# Patient Record
Sex: Male | Born: 1960 | Race: White | Hispanic: No | Marital: Single
Health system: Southern US, Community
[De-identification: ages and names within clinical notes are randomized; demographics above are authoritative.]

---

## 2016-08-03 ENCOUNTER — Encounter: Payer: Medicaid Other | Attending: Internal Medicine | Admitting: Internal Medicine

## 2016-08-03 DIAGNOSIS — Z931 Gastrostomy status: Secondary | ICD-10-CM | POA: Diagnosis not present

## 2016-08-03 DIAGNOSIS — Y842 Radiological procedure and radiotherapy as the cause of abnormal reaction of the patient, or of later complication, without mention of misadventure at the time of the procedure: Secondary | ICD-10-CM | POA: Insufficient documentation

## 2016-08-03 DIAGNOSIS — Z87891 Personal history of nicotine dependence: Secondary | ICD-10-CM | POA: Insufficient documentation

## 2016-08-03 DIAGNOSIS — C139 Malignant neoplasm of hypopharynx, unspecified: Secondary | ICD-10-CM | POA: Insufficient documentation

## 2016-08-03 DIAGNOSIS — L598 Other specified disorders of the skin and subcutaneous tissue related to radiation: Secondary | ICD-10-CM | POA: Insufficient documentation

## 2016-08-03 DIAGNOSIS — E43 Unspecified severe protein-calorie malnutrition: Secondary | ICD-10-CM | POA: Insufficient documentation

## 2016-08-03 DIAGNOSIS — Z930 Tracheostomy status: Secondary | ICD-10-CM | POA: Diagnosis not present

## 2016-08-03 NOTE — Progress Notes (Signed)
[name], [name] ([id number]) Visit Report for 08/03/2016 Abuse/Suicide Risk Screen Details Patient Name: [name], [name] Date of Service: 08/03/2016 2:15 PM Medical Record Patient Account Number: [id number] Number: Treating RN: [name] [name] (54 y.o. Other Clinician: Date of Birth/Sex: Male) Treating [name] Primary Care Physician/Extender: [name] Physician: Referring Physician: Nicky Pugh [name] in Treatment: 0 Abuse/Suicide Risk Screen Items Answer ABUSE/SUICIDE RISK SCREEN: Has anyone close to you tried to hurt or harm you recentlyo No Do you feel uncomfortable with anyone in your familyo No Has anyone forced you do things that you didnot want to doo No Do you have any thoughts of harming yourselfo No Patient displays signs or symptoms of abuse and/or neglect. No Electronic Signature(s) Signed: 08/03/2016 4:22:14 PM By: [name] Entered By: [name] on 08/03/2016 14:27:39 [name] ([id number]) -------------------------------------------------------------------------------- Activities of Daily Living Details Patient Name: [name] Date of Service: 08/03/2016 2:15 PM Medical Record Patient Account Number: [id number] Number: Treating RN: [name] [name] (55 y.o. Other Clinician: Date of Birth/Sex: Male) Treating [name] Primary Care Physician/Extender: [name] Physician: Referring Physician: Nicky Pugh [name] in Treatment: 0 Activities of Daily Living Items Answer Activities of Daily Living (Please select one for each item) Drive Automobile Not Able Take Medications Need Assistance Use Telephone Completely Able Care for Appearance Need Assistance Use Toilet Completely Able Bath / Shower Need Assistance Dress Self Completely Able Feed Self Completely Able Walk Completely Able Get In / Out Bed Completely Able Housework Need Assistance Prepare Meals Not Able Handle Money  Need Assistance Shop for Self Not Able Electronic Signature(s) Signed: 08/03/2016 4:22:14 PM By: [name] Entered By: [name] on 08/03/2016 14:28:59 [name] ([id number]) -------------------------------------------------------------------------------- Education Assessment Details Patient Name: [name] Date of Service: 08/03/2016 2:15 PM Medical Record Patient Account Number: [id number] Number: Treating RN: [name] [name] (54 y.o. Other Clinician: Date of Birth/Sex: Male) Treating [name] Primary Care Physician/Extender: [name] Physician: Referring Physician: Milana Na in Treatment: 0 Primary Learner Assessed: Patient Learning Preferences/Education Level/Primary Language Learning Preference: Explanation, Printed Material Highest Education Level: College or Above Preferred Language: English Cognitive Barrier Assessment/Beliefs Language Barrier: No Translator Needed: No Memory Deficit: No Emotional Barrier: No Cultural/Religious Beliefs Affecting Medical No Care: Physical Barrier Assessment Impaired Vision: No Impaired Hearing: No Decreased Hand dexterity: No Knowledge/Comprehension Assessment Knowledge Level: High Comprehension Level: High Ability to understand written High instructions: Ability to understand verbal High instructions: Motivation Assessment Anxiety Level: Calm Cooperation: Cooperative Education Importance: Acknowledges Need Interest in Health Problems: Asks Questions Perception: Coherent Willingness to Engage in Self- High Management Activities: [name] ([id number]) Readiness to Engage in Self- Management Activities: Electronic Signature(s) Signed: 08/03/2016 4:22:14 PM By: [name] Entered By: [name] on 08/03/2016 14:29:27 [name] ([id number]) -------------------------------------------------------------------------------- Fall Risk  Assessment Details Patient Name: [name] Date of Service: 08/03/2016 2:15 PM Medical Record Patient Account Number: [id number] Number: Treating RN: [name] [name] (54 y.o. Other Clinician: Date of Birth/Sex: Male) Treating [name] Primary Care Physician/Extender: [name] Physician: Referring Physician: Nicky Pugh [name] in Treatment: 0 Fall Risk Assessment Items Have you had 2 or more falls in the last 12 monthso 0 No Have you had any fall that resulted in injury in the last 12 monthso 0 No FALL RISK ASSESSMENT: History of falling - immediate or within 3 months 0 No Secondary diagnosis 15 Yes Ambulatory aid None/bed rest/wheelchair/nurse 0 No Crutches/cane/walker 0 No Furniture 0 No IV Access/Saline  Lock 0 No Gait/Training Normal/bed rest/immobile 0 No Weak 10 Yes Impaired 0 No Mental Status Oriented to own ability 0 Yes Electronic Signature(s) Signed: 08/03/2016 4:22:14 PM By: [name] Entered By: [name] on 08/03/2016 14:29:46 [name] ([id number]) -------------------------------------------------------------------------------- Foot Assessment Details Patient Name: [name] Date of Service: 08/03/2016 2:15 PM Medical Record Patient Account Number: [id number] Number: Treating RN: [name] (55 y.o. Other Clinician: Date of Birth/Sex: Male) Treating [name] Primary Care Physician/Extender: [name] Physician: Referring Physician: Nicky Pugh [name] in Treatment: 0 Foot Assessment Items Site Locations + = Sensation present, - = Sensation absent, C = Callus, U = Ulcer R = Redness, W = Warmth, M = Maceration, PU = Pre-ulcerative lesion F = Fissure, S = Swelling, D = Dryness Assessment Right: Left: Other Deformity: No No Prior Foot Ulcer: No No Prior Amputation: No No Charcot Joint: No No Ambulatory Status: Gait: Electronic Signature(s) Signed:  08/03/2016 4:22:14 PM By: [name] Entered By: [name] on 08/03/2016 14:30:50 [name] ([id number]) [name] ([id number]) -------------------------------------------------------------------------------- Nutrition Risk Assessment Details Patient Name: [name] Date of Service: 08/03/2016 2:15 PM Medical Record Patient Account Number: [id number] Number: Treating RN: [name] [name] (55 y.o. Other Clinician: Date of Birth/Sex: Male) Treating [name] Primary Care Physician/Extender: [name] Physician: Referring Physician: Nicky Pugh [name] in Treatment: 0 Height (in): 68 Weight (lbs): 115 Body Mass Index (BMI): 17.5 Nutrition Risk Assessment Items NUTRITION RISK SCREEN: I have an illness or condition that made me change the kind and/or 2 Yes amount of food I eat I eat fewer than two meals per day 0 No I eat few fruits and vegetables, or milk products 0 No I have three or more drinks of beer, liquor or wine almost every day 0 No I have tooth or mouth problems that make it hard for me to eat 0 No I don't always have enough money to buy the food I need 0 No I eat alone most of the time 0 No I take three or more different prescribed or over-the-counter drugs a 1 Yes day Without wanting to, I have lost or gained 10 pounds in the last six 2 Yes months I am not always physically able to shop, cook and/or feed myself 0 No Nutrition Protocols Good Risk Protocol Provide education on Moderate Risk Protocol 0 nutrition Electronic Signature(s) Signed: 08/03/2016 4:22:14 PM By: [name] Entered By: [name] on 08/03/2016 14:30:40

## 2016-08-05 NOTE — Progress Notes (Signed)
DAK, CHEUNG ([id number]) Visit Report for 08/03/2016 Chief Complaint Document Details Patient Name: [name], [name] Date of Service: 08/03/2016 2:15 PM Medical Record Patient Account Number: [id number] Number: Treating RN: Ahmed Prima 1961/05/06 (55 y.o. Other Clinician: Date of Birth/Sex: Male) Treating Kynzleigh Bandel Primary Care Physician/Extender: Orland Dec, ERNEST Physician: Referring Physician: Nicky Pugh Weeks in Treatment: 0 Information Obtained from: Patient Chief Complaint Patient is here for consultation with regards to soft tissue radionecrosis of the larynx Electronic Signature(s) Signed: 08/04/2016 4:32:31 PM By: Linton Ham MD Entered By: Linton Ham on 08/03/2016 15:50:53 [place], [place] ([id number]) -------------------------------------------------------------------------------- HPI Details Patient Name: [name] Date of Service: 08/03/2016 2:15 PM Medical Record Patient Account Number: [id number] Number: Treating RN: Ahmed Prima January 18, 1961 (55 y.o. Other Clinician: Date of Birth/Sex: Male) Treating Jalaiya Oyster Primary Care Physician/Extender: Orland Dec, ERNEST Physician: Referring Physician: Nicky Pugh Weeks in Treatment: 0 History of Present Illness HPI Description: 08/03/16; this is a 55 year old man who was referred by his otolaryngologist Dr. 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 Per the patient this was diagnosed in February of this year. He was a 37 year pack year smoker to that point but has stopped smoking since that time. He was treated with both radiation and chemotherapy at [facility]. Unfortunately I have none of these details. He apparently completed his treatment regimen in on 04/23/16. The patient was living at home apparently functioning on his own and [place]. He tells me initially things went well however he developed increasing difficulties talking and swallowing with severe throat pain and he was admitted to hospital at [facility] in July. No specific details of this hospitalization however on 07/19/16 he required placement of a PEG tube for nutritional support. Patient tells me that he can only take liquids and cannot swallow solid food. He is listed as having severe protein calorie malnutrition. He required placement of a tracheostomy on 8/10 secondary to significant laryngeal edema which was progressive with airway compromise. Laryngoscopy on 07/12/16 showed significant laryngeal edema so that his vocal cords could not be specifically inspected secondary to edema of the arytenoid process. He has since been admitted to [place] care skilled facility. He is dependent on peg tube feedings. He has a tracheostomy with a Passy-Muir valve and trach collar oxygen Other than the history of cancer the patient's medical history seems unremarkable. He does not carry a history of COPD in spite of his smoking history no cardiac issues. He is not a diabetic. He has stopped smoking I really hope to be able to get into care everywhere to look more specifically at his [facility] records however it may be too soon I could not access any records through [facility]. Currently I do not have his exact the radiation dosage and over what time frame specifically. The patient describes pain in his throat and points to his upper neck area for discomfort. He is able to speak with aid of Passy-Muir valve and his speech is audible. He cannot swallow and is totally PEG tube defendant for nutrition although he can take liquids Electronic Signature(s) Signed: 08/04/2016 4:32:31 PM By: Linton Ham MD Entered By: Linton Ham on 08/03/2016 16:11:06 [name] ([id number]) -------------------------------------------------------------------------------- Physical Exam  Details Patient Name: [name] Date of Service: 08/03/2016 2:15 PM Medical Record Patient Account Number: [id number] Number: Treating RN: Ahmed Prima 1961-09-01 (55 y.o. Other Clinician: Date  of Birth/Sex: Male) Treating Jalin Erpelding Primary Care Physician/Extender: Orland Dec, ERNEST Physician: Referring Physician: Nicky Pugh Weeks in Treatment: 0 Constitutional Sitting or standing Blood Pressure is within target range for patient.. Pulse regular and within target range for patient.Marland Kitchen Respirations regular, non-labored and within target range.. Temperature is normal and within the target range for the patient.. Eyes Conjunctivae clear. No discharge.. Ears, Nose, Mouth, and Throat Patient can hear normal speaking tones without difficulty.. Oropharynx within normal limits, without erythema, exudate or ulceration.. Neck Tracheostomy in place with a trach collar but no oxygen. Respiratory Respiratory effort is easy and symmetric bilaterally. Rate is normal at rest and on room air.. Bilateral breath sounds are clear and equal in all lobes with no wheezes, rales or rhonchi.. Cardiovascular Heart rhythm and rate regular, without murmur or gallop.. Gastrointestinal (GI) No liver or spleen enlargement or tenderness.. Lymphatic . No lymphadenopathy or glandular swelling noted in neck.. No lymphadenopathy or glandular swelling noted in axillae.Marland Kitchen Psychiatric No evidence of depression, anxiety, or agitation. Calm, cooperative, and communicative. Appropriate interactions and affect.. Electronic Signature(s) Signed: 08/04/2016 4:32:31 PM By: Linton Ham MD Entered By: Linton Ham on 08/03/2016 16:21:05 [name] ([id number]) -------------------------------------------------------------------------------- Problem List Details Patient Name: [name] Date of Service: 08/03/2016 2:15 PM Medical Record Patient Account Number:  [id number] Number: Treating RN: Ahmed Prima 1961-08-09 (55 y.o. 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 Other Clinician: Date of Birth/Sex: Male) Treating Brysen Shankman Primary Care Physician/Extender: Orland Dec, ERNEST Physician: Referring Physician: Nicky Pugh Weeks in Treatment: 0 Subjective Chief Complaint Information obtained from Patient Patient is here for consultation with regards to soft tissue radionecrosis of the larynx History of Present Illness (HPI) 08/03/16; this is a 55 year old man who was referred by his otolaryngologist Dr. 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 Per the patient this was diagnosed in  February of this year. He was a 37 year pack year smoker to that point but has stopped smoking since that time. He was treated with both radiation and chemotherapy at [facility]. Unfortunately I have none of these details. He apparently completed his treatment regimen in on 04/23/16. The patient was living at home apparently functioning on his own and [place]. He tells me initially things went well however he developed increasing difficulties talking and swallowing with severe throat pain and he was admitted to hospital at [facility] in July. No specific details of this hospitalization however on 07/19/16 he required placement of a PEG tube for nutritional support. Patient tells me that he can only take liquids and  cannot swallow solid food. He is listed as having severe protein calorie malnutrition. He required placement of a tracheostomy on 8/10 secondary to significant laryngeal edema which was progressive with airway compromise. Laryngoscopy on 07/12/16 showed significant laryngeal edema so that his vocal cords could not be specifically inspected secondary to edema of the arytenoid process. He has since been admitted to [place] care skilled facility. He is dependent on peg tube feedings. He has a tracheostomy with a Passy-Muir valve and trach collar oxygen Other than the history of cancer the patient's medical history seems unremarkable. He does not carry a history of COPD in spite of his smoking history no cardiac issues. He is not a diabetic. He has stopped smoking I really hope to be able to get into care everywhere to look more specifically at his [facility] records however it may be too soon I could not access any records through [place]. Currently I do not have his exact the radiation dosage and over what time frame specifically. The patient describes pain in his throat and points to his upper neck area for discomfort. He is able to speak with aid of  Passy-Muir valve and his speech is audible. He cannot swallow and is totally PEG tube defendant for nutrition although he can take liquids Patient History [name], [name] ([id number]) Information obtained from Patient. Allergies NKDA Family History Cancer - Father, Heart Disease - Father, Lung Disease - Father, No family history of Diabetes, Hereditary Spherocytosis, Hypertension, Kidney Disease, Seizures, Stroke, Thyroid Problems, Tuberculosis. Social History Former smoker - quit Jan 30, 2016 d/t cancer, Marital Status - Single, Alcohol Use - Never, Drug Use - No History, Caffeine Use - Never. Medical History Oncologic Patient has history of Received Chemotherapy - 2017 at [facility], Received Radiation - last treatment was 04/22/2016 at [place] (ROS) Constitutional Symptoms (General Health) The patient has no complaints or symptoms. Eyes The patient has no complaints or symptoms. Ear/Nose/Mouth/Throat trach Hematologic/Lymphatic The patient has no complaints or symptoms. Respiratory trach Cardiovascular The patient has no complaints or symptoms. Gastrointestinal PEG GERD Endocrine The patient has no complaints or symptoms. Genitourinary The patient has no complaints or symptoms. Immunological The patient has no complaints or symptoms. Integumentary (Skin) The patient has no complaints or symptoms. Musculoskeletal The patient has no complaints or symptoms. Neurologic The patient has no complaints or symptoms. [place], [place] ([id number]) Objective Constitutional Sitting or standing Blood Pressure is within target range for patient.. Pulse regular and within target range for patient.Marland Kitchen Respirations regular, non-labored and within target range.. Temperature is normal and within the target range for the patient.. Vitals Time Taken: 2:19 PM, Height: 68 in, Source: Stated, Weight: 115 lbs, Source: Stated, BMI: 17.5, Temperature: 97.8 F, Pulse: 75 bpm, Respiratory  Rate: 16 breaths/min, Blood Pressure: 114/63 mmHg. Eyes Conjunctivae clear. No discharge.. Ears, Nose, Mouth, and Throat Patient can hear normal speaking tones without difficulty.. Oropharynx within normal limits, without erythema, exudate or ulceration.. Neck Tracheostomy in place with a trach collar but no oxygen. Respiratory Respiratory effort is easy and symmetric bilaterally. Rate is normal at rest and on room air.. Bilateral breath sounds are clear and equal in all lobes with no wheezes, rales or rhonchi.. Cardiovascular Heart rhythm and rate regular, without murmur or gallop.. Gastrointestinal (GI) No liver or spleen enlargement or tenderness.. Lymphatic No lymphadenopathy or glandular swelling noted in neck.. No lymphadenopathy or glandular swelling noted in axillae.Marland Kitchen Psychiatric No evidence of depression, anxiety, or agitation. Calm, cooperative, and communicative. 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 Unfortunately I have no access currently to his [employer] records and there is nothing currently on care everywhere in [place]. Superficially I think hyperbaric oxygen could conceivably help this man. #2 on a purely insurance note the patient is currently at [place] under [facility]. I currently have my doubts that [facility]  will cover hyperbaric oxygen for a patient they are currently paying for in a long-term care facility. #3 I will need specifics on the patient's actual radiation dosing and time frame. I'll see what we can do to get more detailed records on this patient. Electronic Signature(s) Signed: 08/04/2016 4:32:31 PM By: Linton Ham MD Entered By: Linton Ham on 08/03/2016 16:27:03 [name] ([id number]) -------------------------------------------------------------------------------- ROS/PFSH Details Patient Name: [name] Date of Service: 08/03/2016 2:15 PM Medical Record Patient Account Number: [id number] Number: Treating RN: Ahmed Prima 03/16/1961 (54 y.o. Other Clinician: Date of Birth/Sex: Male) Treating Nayden Czajka Primary Care Physician/Extender: Orland Dec, ERNEST Physician: Referring Physician: Nicky Pugh Weeks in Treatment: 0 Information Obtained From Patient Wound History Constitutional Symptoms (General Health) Complaints and Symptoms: No Complaints or Symptoms Eyes Complaints and Symptoms: No Complaints or Symptoms Ear/Nose/Mouth/Throat Complaints and Symptoms: Review of System Notes: trach Hematologic/Lymphatic Complaints and Symptoms: No Complaints or Symptoms Respiratory Complaints and Symptoms: Review of System Notes: trach Cardiovascular Complaints and Symptoms: No Complaints or Symptoms Gastrointestinal Complaints and Symptoms: Review of System Notes: PEG [name], [name] ([id number]) GERD Endocrine Complaints and Symptoms: No Complaints or Symptoms Genitourinary Complaints and Symptoms: No Complaints or Symptoms Immunological Complaints and Symptoms: No Complaints or Symptoms Integumentary (Skin) Complaints and Symptoms: No Complaints or Symptoms Musculoskeletal Complaints and Symptoms: No Complaints or Symptoms Neurologic Complaints and Symptoms: No Complaints or Symptoms Oncologic Medical History: Positive  for: Received Chemotherapy - 2017 at [facility]; Received Radiation - last treatment was 04/22/2016 at [facility] Immunizations Pneumococcal Vaccine: Received Pneumococcal Vaccination: No Family and Social History Cancer: Yes - Father; Diabetes: No; Heart Disease: Yes - Father; Hereditary Spherocytosis: No; Hypertension: No; Kidney Disease: No; Lung Disease: Yes - Father; Seizures: No; Stroke: No; Thyroid Problems: No; Tuberculosis: No; Former smoker - quit Jan 30, 2016 d/t cancer; Marital Status - Single; Alcohol Use: Never; Drug Use: No History; Caffeine Use: Never; Financial Concerns: No; Food, Clothing or Shelter Needs: No; Support System Lacking: No; Transportation Concerns: No; Advanced Directives: No; Patient does not want information on Advanced Directives; Do not resuscitate: No; Living Will: No; Medical Power of AttorneyKort [name], [place] ([id number]) Electronic Signature(s) Signed: 08/03/2016 4:22:14 PM By: Alric Quan Signed: 08/04/2016 4:32:31 PM By: Linton Ham MD Entered By: Alric Quan on 08/03/2016 14:42:10 [place], [place] ([id number]) -------------------------------------------------------------------------------- SuperBill Details Patient Name: [name] Date of Service: 08/03/2016 Medical Record Patient Account Number: [id number] Number: Treating RN: Ahmed Prima 04/15/1961 (54 y.o. Other Clinician: Date of Birth/Sex: Male) Treating Nusrat Encarnacion Primary Care Physician: Nicky Pugh Physician/Extender: G Referring Physician: Nicky Pugh Weeks in Treatment: 0 Diagnosis Coding ICD-10 Codes Code Description  L59.8 Other specified disorders of the skin and subcutaneous tissue related to radiation Facility Procedures CPT4 Code: [id number] Description: [id number] - WOUND CARE VISIT-LEV 3 EST PT Modifier: Quantity: 1 Physician Procedures CPT4: Description Modifier Quantity Code [id number] - WC PHYS LEVEL 4 - NEW PT 1 ICD-10 Description Diagnosis L59.8  Other specified disorders of the skin and subcutaneous tissue related to radiation Electronic Signature(s) Signed: 08/04/2016 4:32:31 PM By: Linton Ham MD Previous Signature: 08/03/2016 4:22:14 PM Version By: Alric Quan Entered By: Linton Ham on 08/03/2016 16:28:13

## 2016-08-05 NOTE — Progress Notes (Signed)
CHAWN, PIECH ([id number]) Visit Report for 08/03/2016 Allergy List Details Patient Name: [name], [name] Date of Service: 08/03/2016 2:15 PM Medical Record Patient Account Number: [id number] Number: Treating RN: Ahmed Prima 05-13-1961 (55 y.o. Other Clinician: Date of Birth/Sex: Male) Treating ROBSON, MICHAEL Primary Care Physician: Nicky Pugh Physician/Extender: G Referring Physician: Nicky Pugh Weeks in Treatment: 0 Allergies Active Allergies NKDA Allergy Notes Electronic Signature(s) Signed: 08/03/2016 4:22:14 PM By: Alric Quan Entered By: Alric Quan on 08/03/2016 14:22:21 [place], [name] ([id number]) -------------------------------------------------------------------------------- Arrival Information Details Patient Name: [name] Date of Service: 08/03/2016 2:15 PM Medical Record Patient Account Number: [id number] Number: Treating RN: Ahmed Prima Jun 17, 1961 (55 y.o. Other Clinician: Date of Birth/Sex: Male) Treating ROBSON, MICHAEL Primary Care Physician: Nicky Pugh Physician/Extender: G Referring Physician: Milana Na in Treatment: 0 Visit Information Patient Arrived: Ambulatory Arrival Time: 14:18 Accompanied By: self Transfer Assistance: None Patient Identification Verified: Yes Secondary Verification Process Yes Completed: Patient Requires Transmission-Based No Precautions: Patient Has Alerts: No Electronic Signature(s) Signed: 08/03/2016 4:22:14 PM By: Alric Quan Entered By: Alric Quan on 08/03/2016 14:19:03 [place], [place] ([id number]) -------------------------------------------------------------------------------- Clinic Level of Care Assessment Details Patient Name: [name] Date of Service: 08/03/2016 2:15 PM Medical Record Patient Account Number: [id number] Number: Treating RN: Ahmed Prima 05-04-1961 (55 y.o. Other Clinician: Date of Birth/Sex: Male) Treating ROBSON,  MICHAEL Primary Care Physician: Nicky Pugh Physician/Extender: G Referring Physician: Milana Na in Treatment: 0 Clinic Level of Care Assessment Items TOOL 2 Quantity Score X - Use when only an EandM is performed on the INITIAL visit 1 0 ASSESSMENTS - Nursing Assessment / Reassessment X - General Physical Exam (combine w/ comprehensive assessment (listed just 1 20 below) when performed on new pt. evals) X - Comprehensive Assessment (HX, ROS, Risk Assessments, Wounds Hx, etc.) 1 25 ASSESSMENTS - Wound and Skin Assessment / Reassessment []  - Simple Wound Assessment / Reassessment - one wound 0 []  - Complex Wound Assessment / Reassessment - multiple wounds 0 []  - Dermatologic / Skin Assessment (not related to wound area) 0 ASSESSMENTS - Ostomy and/or Continence Assessment and Care []  - Incontinence Assessment and Management 0 []  - Ostomy Care Assessment and Management (repouching, etc.) 0 PROCESS - Coordination of Care []  - Simple Patient / Family Education for ongoing care 0 X - Complex (extensive) Patient / Family Education for ongoing care 1 20 X - Staff obtains Programmer, systems, Records, Test Results / Process Orders 1 10 X - Staff telephones HHA, Nursing Homes / Clarify orders / etc 1 10 []  - Routine Transfer to another Facility (non-emergent condition) 0 []  - Routine Hospital Admission (non-emergent condition) 0 X - New Admissions / Biomedical engineer / Ordering NPWT, Apligraf, etc. 1 15 []  - Emergency Hospital Admission (emergent condition) 0 [place], [place] ([id number]) X - Simple Discharge Coordination 1 10 []  - Complex (extensive) Discharge Coordination 0 PROCESS - Special Needs []  - Pediatric / Minor Patient Management 0 []  - Isolation Patient Management 0 []  - Hearing / Language / Visual special needs 0 []  - Assessment of Community assistance (transportation, D/C planning, etc.) 0 []  - Additional assistance / Altered mentation 0 []  - Support Surface(s) Assessment  (bed, cushion, seat, etc.) 0 INTERVENTIONS - Wound Cleansing / Measurement []  - Wound Imaging (photographs - any number of wounds) 0 []  - Wound Tracing (instead of photographs) 0 []  - Simple Wound Measurement - one wound 0 []  - Complex Wound Measurement - multiple wounds 0 []  - Simple Wound Cleansing - one wound 0 []  -  Complex Wound Cleansing - multiple wounds 0 INTERVENTIONS - Wound Dressings []  - Small Wound Dressing one or multiple wounds 0 []  - Medium Wound Dressing one or multiple wounds 0 []  - Large Wound Dressing one or multiple wounds 0 []  - Application of Medications - injection 0 INTERVENTIONS - Miscellaneous []  - External ear exam 0 []  - Specimen Collection (cultures, biopsies, blood, body fluids, etc.) 0 []  - Specimen(s) / Culture(s) sent or taken to Lab for analysis 0 []  - Patient Transfer (multiple staff / [name] Lift / Similar devices) 0 []  - Simple Staple / Suture removal (25 or less) 0 [place], Alfonsa ([id number]) []  - Complex Staple / Suture removal (26 or more) 0 []  - Hypo / Hyperglycemic Management (close monitor of Blood Glucose) 0 []  - Ankle / Brachial Index (ABI) - do not check if billed separately 0 Has the patient been seen at the hospital within the last three years: Yes Total Score: 110 Level Of Care: New/Established - Level 3 Electronic Signature(s) Signed: 08/03/2016 4:22:14 PM By: Alric Quan Entered By: Alric Quan on 08/03/2016 [place], [place] ([id number]) -------------------------------------------------------------------------------- Encounter Discharge Information Details Patient Name: [name] Date of Service: 08/03/2016 2:15 PM Medical Record Patient Account Number: [id number] Number: Treating RN: Ahmed Prima [birth year] (55 y.o. Other Clinician: Date of Birth/Sex: Male) Treating ROBSON, MICHAEL Primary Care Physician: Nicky Pugh Physician/Extender: G Referring Physician: Milana Na in Treatment:  0 Encounter Discharge Information Items Discharge Pain Level: 0 Discharge Condition: Stable Ambulatory Status: Ambulatory Discharge Destination: Home Transportation: Other Accompanied By: self Schedule Follow-up Appointment: No Medication Reconciliation completed and provided to Patient/Care No Alanya Vukelich: Provided on Clinical Summary of Care: 08/03/2016 Form Type Recipient Paper Patient mr Electronic Signature(s) Signed: 08/03/2016 3:22:53 PM By: Lorine Bears RCP, RRT, CHT Entered By: Lorine Bears on 08/03/2016 15:22:53 [place], [place] ([id number]) -------------------------------------------------------------------------------- Lower Extremity Assessment Details Patient Name: [name] Date of Service: 08/03/2016 2:15 PM Medical Record Patient Account Number: [id number] Number: Treating RN: Ahmed Prima 07/21/1961 (54 y.o. Other Clinician: Date of Birth/Sex: Male) Treating ROBSON, MICHAEL Primary Care Physician: Nicky Pugh Physician/Extender: G Referring Physician: Nicky Pugh Weeks in Treatment: 0 Electronic Signature(s) Signed: 08/03/2016 4:22:14 PM By: Alric Quan Entered By: Alric Quan on 08/03/2016 14:22:35 [place], [place] ([id number]) -------------------------------------------------------------------------------- Multi Wound Chart Details Patient Name: [name] Date of Service: 08/03/2016 2:15 PM Medical Record Patient Account Number: [id number] Number: Treating RN: Ahmed Prima 1961/02/20 (54 y.o. Other Clinician: Date of Birth/Sex: Male) Treating ROBSON, MICHAEL Primary Care Physician: Nicky Pugh Physician/Extender: G Referring Physician: Nicky Pugh Weeks in Treatment: 0 Vital Signs Height(in): 68 Pulse(bpm): 75 Weight(lbs): 115 Blood Pressure 114/63 (mmHg): Body Mass Index(BMI): 17 Temperature(F): 97.8 Respiratory Rate 16 (breaths/min): Wound Assessments Treatment  Notes Electronic Signature(s) Signed: 08/03/2016 4:22:14 PM By: Alric Quan Entered By: Alric Quan on 08/03/2016 15:00:38 [name] ([id number]) -------------------------------------------------------------------------------- Multi-Disciplinary Care Plan Details Patient Name: [name] Date of Service: 08/03/2016 2:15 PM Medical Record Patient Account Number: [id number] Number: Treating RN: Ahmed Prima 19-Nov-1961 (55 y.o. Other Clinician: Date of Birth/Sex: Male) Treating ROBSON, MICHAEL Primary Care Physician: Nicky Pugh Physician/Extender: G Referring Physician: Nicky Pugh Weeks in Treatment: 0 Active Inactive Abuse / Safety / Falls / Self Care Management Nursing Diagnoses: Potential for falls Goals: Patient will remain injury free Date Initiated: 08/03/2016 Goal Status: Active Interventions: Assess fall risk on admission and as needed Notes: Nutrition Nursing Diagnoses: Imbalanced nutrition Goals: Patient/caregiver agrees to and verbalizes understanding of need to use nutritional supplements  and/or vitamins as prescribed Date Initiated: 08/03/2016 Goal Status: Active Interventions: Assess patient nutrition upon admission and as needed per policy Notes: Orientation to the Wound Care Program Nursing Diagnoses: Knowledge deficit related to the wound healing center program [place], [place] ([id number]) Goals: Patient/caregiver will verbalize understanding of the [facility] Date Initiated: 08/03/2016 Goal Status: Active Interventions: Provide education on orientation to the wound center Notes: Pain, Acute or Chronic Nursing Diagnoses: Pain, acute or chronic: actual or potential Potential alteration in comfort, pain Goals: Patient will verbalize adequate pain control and receive pain control interventions during procedures as needed Date Initiated: 08/03/2016 Goal Status: Active Interventions: Assess comfort goal  upon admission Complete pain assessment as per visit requirements Notes: Electronic Signature(s) Signed: 08/03/2016 4:22:14 PM By: Alric Quan Entered By: Alric Quan on 08/03/2016 14:59:59 [place], [place] ([id number]) -------------------------------------------------------------------------------- Pain Assessment Details Patient Name: [name] Date of Service: 08/03/2016 2:15 PM Medical Record Patient Account Number: [id number] Number: Treating RN: Ahmed Prima 08-Nov-1961 (55 y.o. Other Clinician: Date of Birth/Sex: Male) Treating ROBSON, MICHAEL Primary Care Physician: Nicky Pugh Physician/Extender: G Referring Physician: Nicky Pugh Weeks in Treatment: 0 Active Problems Location of Pain Severity and Description of Pain Patient Has Paino Yes Site Locations Rate the pain. Current Pain Level: 3 Worst Pain Level: 6 Least Pain Level: 1 Character of Pain Describe the Pain: Aching, Throbbing Pain Management and Medication Current Pain Management: Notes new trach opening Electronic Signature(s) Signed: 08/03/2016 4:22:14 PM By: Alric Quan Entered By: Alric Quan on 08/03/2016 14:19:56 [place], [place] ([id number]) -------------------------------------------------------------------------------- Patient/Caregiver Education Details Patient Name: [name] Date of Service: 08/03/2016 2:15 PM Medical Record Patient Account Number: [id number] Number: Treating RN: Ahmed Prima Aug 07, 1961 (54 y.o. Other Clinician: Date of Birth/Gender: Male) Treating ROBSON, MICHAEL Primary Care Physician: Nicky Pugh Physician/Extender: G Referring Physician: Milana Na in Treatment: 0 Education Assessment Education Provided To: Patient Education Topics Provided Welcome To [facility]: Handouts: Welcome [facility] Methods: Explain/Verbal Electronic Signature(s) Signed: 08/03/2016 4:22:14 PM By: Alric Quan Entered By: Alric Quan on 08/03/2016 15:16:47 [place], [place] ([id number]) -------------------------------------------------------------------------------- Vitals Details Patient Name: [name] Date of Service: 08/03/2016 2:15 PM Medical Record Patient Account Number: [id number] Number: Treating RN: Ahmed Prima May 11, 1961 (55 y.o. Other Clinician: Date of Birth/Sex: Male) Treating ROBSON, [place] Primary Care Physician: Nicky Pugh Physician/Extender: G Referring Physician: Nicky Pugh Weeks in Treatment: 0 Vital Signs Time Taken: 14:19 Temperature (F): 97.8 Height (in): 68 Pulse (bpm): 75 Source: Stated Respiratory Rate (breaths/min): 16 Weight (lbs): 115 Blood Pressure (mmHg): 114/63 Source: Stated Reference Range: 80 - 120 mg / dl Body Mass Index (BMI): 17.5 Electronic Signature(s) Signed: 08/03/2016 4:22:14 PM By: Alric Quan Entered By: Alric Quan on 08/03/2016 14:21:55

## 2016-08-24 ENCOUNTER — Ambulatory Visit
Admission: RE | Admit: 2016-08-24 | Discharge: 2016-08-24 | Disposition: A | Discharge status: Home / Self Care | Payer: Medicaid Other | Source: Ambulatory Visit | Attending: Internal Medicine | Admitting: Internal Medicine

## 2016-08-24 ENCOUNTER — Encounter: Payer: MEDICAID | Admitting: Internal Medicine

## 2016-08-24 ENCOUNTER — Other Ambulatory Visit: Payer: Self-pay | Admitting: Internal Medicine

## 2016-08-24 DIAGNOSIS — Z9889 Other specified postprocedural states: Secondary | ICD-10-CM | POA: Diagnosis present

## 2016-08-24 DIAGNOSIS — Z9289 Personal history of other medical treatment: Secondary | ICD-10-CM

## 2016-08-24 DIAGNOSIS — J984 Other disorders of lung: Secondary | ICD-10-CM | POA: Insufficient documentation

## 2016-08-25 ENCOUNTER — Encounter: Payer: Medicaid Other | Attending: Internal Medicine | Admitting: Internal Medicine

## 2016-08-25 DIAGNOSIS — Y842 Radiological procedure and radiotherapy as the cause of abnormal reaction of the patient, or of later complication, without mention of misadventure at the time of the procedure: Secondary | ICD-10-CM | POA: Diagnosis not present

## 2016-08-25 DIAGNOSIS — C139 Malignant neoplasm of hypopharynx, unspecified: Secondary | ICD-10-CM | POA: Insufficient documentation

## 2016-08-25 DIAGNOSIS — Z930 Tracheostomy status: Secondary | ICD-10-CM | POA: Diagnosis not present

## 2016-08-25 DIAGNOSIS — Z87891 Personal history of nicotine dependence: Secondary | ICD-10-CM | POA: Diagnosis not present

## 2016-08-25 DIAGNOSIS — E43 Unspecified severe protein-calorie malnutrition: Secondary | ICD-10-CM | POA: Diagnosis not present

## 2016-08-25 DIAGNOSIS — Z931 Gastrostomy status: Secondary | ICD-10-CM | POA: Insufficient documentation

## 2016-08-25 DIAGNOSIS — L598 Other specified disorders of the skin and subcutaneous tissue related to radiation: Secondary | ICD-10-CM | POA: Insufficient documentation

## 2016-08-25 NOTE — Progress Notes (Signed)
TIRSO, STOCKS ([id number]) Visit Report for 08/25/2016 Arrival Information Details Patient Name: [name], [name] Date of Service: 08/25/2016 10:00 AM Medical Record Patient Account Number: [id number] Number: Treating RN: 09/18/1961 (55 y.o. Other Clinician: Jacqulyn Bath Date of Birth/Sex: Male) Treating ROBSON, MICHAEL Primary Care Physician/Extender: Orland Dec, ERNEST Physician: Referring Physician: Milana Na in Treatment: 3 Visit Information History Since Last Visit Added or deleted any medications: No Patient Arrived: Ambulatory Any new allergies or adverse reactions: No Arrival Time: 09:20 Had a fall or experienced change in No Accompanied By: self activities of daily living that may affect Transfer Assistance: None risk of falls: Patient Identification Verified: Yes Signs or symptoms of abuse/neglect since last No Secondary Verification Process Yes visito Completed: Hospitalized since last visit: No Patient Requires Transmission-Based No Pain Present Now: No Precautions: Patient Has Alerts: No Electronic Signature(s) Signed: 08/25/2016 1:38:04 PM By: Lorine Bears RCP, RRT, CHT Entered By: Lorine Bears on 08/25/2016 10:49:11 [place], [name] ([id number]) -------------------------------------------------------------------------------- Encounter Discharge Information Details Patient Name: [name] Date of Service: 08/25/2016 10:00 AM Medical Record Patient Account Number: [id number] Number: Treating RN: 07/13/1961 (55 y.o. Other Clinician: Jacqulyn Bath Date of Birth/Sex: Male) Treating ROBSON, MICHAEL Primary Care Physician/Extender: Orland Dec, ERNEST Physician: Referring Physician: Milana Na in Treatment: 3 Encounter Discharge Information Items Discharge Pain Level: 0 Discharge Condition: Stable Ambulatory Status: Ambulatory Other (Note Discharge Destination: Required) Transportation:  Other [name] Accompanied By: [name] Schedule Follow-up Appointment: No Medication Reconciliation completed and provided to Patient/Care No Charika Mikelson: Clinical Summary of Care: Notes Patient will return to [facility] from her via [place]. Patient has an HBO treatment scheduled on 08/26/16 at 10:00 am. Electronic Signature(s) Signed: 08/25/2016 1:38:04 PM By: Lorine Bears RCP, RRT, CHT Entered By: Lorine Bears on 08/25/2016 12:23:57 [place], [name] ([id number]) -------------------------------------------------------------------------------- Vitals Details Patient Name: [name] Date of Service: 08/25/2016 10:00 AM Medical Record Patient Account Number: [id number] Number: Treating RN: 01-15-1961 (55 y.o. Other Clinician: Jacqulyn Bath Date of Birth/Sex: Male) Treating ROBSON, MICHAEL Primary Care Physician/Extender: Orland Dec, ERNEST Physician: Referring Physician: Nicky Pugh Weeks in Treatment: 3 Vital Signs Time Taken: 09:25 Temperature (F): 98.5 Height (in): 68 Pulse (bpm): 90 Weight (lbs): 115 Respiratory Rate (breaths/min): 16 Body Mass Index (BMI): 17.5 Blood Pressure (mmHg): 112/80 Reference Range: 80 - 120 mg / dl Electronic Signature(s) Signed: 08/25/2016 1:38:04 PM By: Lorine Bears RCP, RRT, CHT Entered By: Lorine Bears on 08/25/2016 10:49:45

## 2016-08-25 NOTE — Progress Notes (Addendum)
[name], [name] ([id number]) Visit Report for 08/25/2016 HBO Details Patient Name: [name], [name] Date of Service: 08/25/2016 10:00 AM Medical Record Patient Account Number: [id number] Number: Treating RN: 04/19/1961 (55 y.o. 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 HBO Treatment Details Treatment Number: 1 Patient Type: Outpatient Chamber Type: Monoplace Chamber Serial #: [id number] Treatment Protocol: 2.0 ATA with 90 minutes oxygen, and no air breaks Treatment Details Compression Rate Down: 1.5 psi / minute De-Compression Rate Up: 1.5 psi / minute Air breaks and breathing Compress Tx Pressure periods Decompress Decompress Begins Reached (leave unused spaces Begins Ends blank) Chamber Pressure (ATA) 1 2 - - - - - - 2 1 Clock Time (24 hr) 09:58 10:08 - - - - - - 11:38 11:48 Treatment Length: 110 (minutes) Treatment Segments: 4 Capillary Blood Glucose Pre Capillary Blood Glucose (mg/dl): [name] Capillary Blood Glucose (mg/dl): Vital Signs Capillary Blood Glucose Reference Range: 80 - 120 mg / dl HBO Diabetic Blood Glucose Intervention Range: <131 mg/dl or >249 mg/dl Time Vitals Blood Respiratory Capillary Blood Glucose Pulse Action Type: Pulse: Temperature: Taken: Pressure: Rate: Glucose (mg/dl): Meter #: Oximetry (%) Taken: Pre 09:25 112/80 90 16 98.5 [name] 11:50 124/90 66 16 98.2 [place], [place] ([id number]) Treatment Response Treatment Completion Status: Treatment Completed without Adverse Event Treatment Notes 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 Per Jonelle Sidle at [email or web address] this neck band is completely wrapped with kerlix gauze so that the Velcro is covered entirely for chamber safety. The patient also has a feeding tube which has been securely taped to his abdomen for safety during the treatment Physician Notes No concerns with treatment given. Patient's first dive tolerated this well. 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 Otherwise his cardiopulmonary exams were normal. Tympanic membranes look normal as well HBO Attestation I certify that I supervised this HBO treatment in accordance with Medicare guidelines. A trained Yes emergency response team is readily available per hospital policies and procedures. Continue HBOT as ordered. Yes Electronic Signature(s) Signed: 08/25/2016 4:24:47 PM By: Linton Ham MD Previous Signature: 08/25/2016 1:38:04 PM Version By: Lorine Bears RCP, RRT, CHT Entered By: Linton Ham on 08/25/2016 14:35:15 [place], [place] ([id number]) -------------------------------------------------------------------------------- HBO Risk Assessment Details Patient Name: [name] Date of Service: 08/25/2016 10:00 AM Medical Record Patient Account Number: [id number] Number: Treating RN: Baruch Gouty, RN, BSN, Rita 06-Oct-1961 [phone] y.o. Other Clinician: Jacqulyn Bath Date of Birth/Sex: Male) Treating ROBSON, MICHAEL Primary Care Physician/Extender: Orland Dec, ERNEST Physician: Referring Physician: Nicky Pugh Weeks in Treatment: 3 HBO Risk Assessment Items Answer Barotrauma Risks: Upper Respiratory Infections Yes Prior Radiation Treatment to Head/Neck Yes Tracheostomy Yes Ear problems or surgery (otosclerosis)- Consider pressure equalization tubes No Sinus Problems, Sinus Obstruction No Pulmonary Risks: Currently seeing a pulmonologisto No Emphysema No Pneumothorax No Tuberculosis No Other lung problems (COPD with CO2  retention, lesions, surgery) -Refer to CPGs No Congestive heart Failure -Consider holding HBO if ejection fraction<30% No History of smoking No Bullous Disease, Blebs No Cardiac Risks: Currently seeing a cardiologisto No Pacemaker/AICD No Hypertension No Diuretic Used (water pill). If yes, last time taken: No History of prior or current malignancy (Cancer) Surgery No Radiation  therapy No Chemotherapy No Ophthalmic Risks: Optic Neuritis No Cataracts No Myopia Yes [name], [name] ([id number]) Retinopathy or Retinal Detachment Surgery- Consider pressure equalization tubes No Confinement Anxiety Claustrophobia No Dialysis Dialysis No Any implants; medical or non-medical No Pregnancy No Diabetes HgbA1C within 3 months No Seizures Seizures No Currently using these medications: Aspirin No Digoxin (CHF patient) No Narcotics Yes Nitroprusside No Phenothiazine (Thorazine,etc.) No Prednisone or other steroids No Disulfiram (Antabuse) No Mafenide Acetate (Sulfamylon-burn cream) No Amiodarone No Electronic Signature(s) Signed: 08/25/2016 9:29:45 AM By: Regan Lemming BSN, RN Entered By: Regan Lemming on 08/25/2016 09:29:43 [name], [name] ([id number]) -------------------------------------------------------------------------------- HBO Safety Checklist Details Patient Name: [name] Date of Service: 08/25/2016 10:00 AM Medical Record Patient Account Number: [id number] Number: Treating RN: [birth year] (55 y.o. Other Clinician: Jacqulyn Bath Date of Birth/Sex: Male) Treating ROBSON, MICHAEL Primary Care Physician/Extender: Orland Dec, ERNEST Physician: Referring Physician: Nicky Pugh Weeks in Treatment: 3 HBO Safety Checklist Items Safety Checklist Consent Form Signed Patient voided / foley secured and emptied When did you last eato 7:15 am tube feeding Last dose of injectable or oral agent n/a NA Ostomy pouch emptied and vented if applicable NA All implantable  devices assessed, documented and approved NA Intravenous access site secured and place Valuables secured Linens and cotton and cotton/polyester blend (less than 51% polyester) Personal oil-based products / skin lotions / body lotions removed NA Wigs or hairpieces removed NA Smoking or tobacco materials removed Books / newspapers / magazines / loose paper removed Cologne, aftershave, perfume and deodorant removed Jewelry removed (may wrap wedding band) NA Make-up removed Hair care products removed Battery operated devices (external) removed Heating patches and chemical warmers removed NA Titanium eyewear removed NA Nail polish cured greater than 10 hours NA Casting material cured greater than 10 hours NA Hearing aids removed NA Loose dentures or partials removed NA Prosthetics have been removed Patient demonstrates correct use of air break device (if applicable) Patient concerns have been addressed Patient grounding bracelet on and cord attached to chamber Specifics for Inpatients (complete in addition to above) Medication sheet sent with patient [name], [name] ([id number]) Intravenous medications needed or due during therapy sent with patient Drainage tubes (e.g. nasogastric tube or chest tube secured and vented) Endotracheal or Tracheotomy tube secured Cuff deflated of air and inflated with saline Airway suctioned Electronic Signature(s) Signed: 08/25/2016 1:38:04 PM By: Lorine Bears RCP, RRT, CHT Entered By: Lorine Bears on 08/25/2016 11:12:11

## 2016-08-26 ENCOUNTER — Encounter: Payer: Medicaid Other | Admitting: Surgery

## 2016-08-26 DIAGNOSIS — C139 Malignant neoplasm of hypopharynx, unspecified: Secondary | ICD-10-CM | POA: Diagnosis not present

## 2016-08-26 NOTE — Progress Notes (Signed)
[name], [name] ([id number]) Visit Report for 08/25/2016 Physician Orders Details Patient Name: [name], [name] Date of Service: 08/25/2016 10:00 AM Medical Record Patient Account Number: [id number] Number: Treating RN: Baruch Gouty, RN, BSN, Rita [birth year] [phone] y.o. Other Clinician: Jacqulyn Bath Date of Birth/Sex: Male) Treating Algernon Mundie Primary Care Physician/Extender: Orland Dec, ERNEST Physician: Referring Physician: Milana Na in Treatment: 3 Verbal / Phone Orders: Yes Clinician: Afful, RN, BSN, Rita Read Back and Verified: Yes Diagnosis Coding Hyperbaric Oxygen Therapy o Evaluate for HBO Therapy o If appropriate for treatment, begin HBOT per protocol: - RadioNecrosis of neck/larynx o 2.0 ATA for 90 Minutes without Air Breaks o One treatment per day (delivered Monday through Friday unless otherwise specified in Special Instructions below): o Total # of Treatments: - 30 HBO Contraindications o HBO contraindications of hyperbaric oxygen therapy were reviewed and the patient found to have no untreated pneumothorax or history of spontaneous pneumothorax. o HBO contraindications of hyperbaric oxygen therapy were reviewed and the patient found to have no history of medications such as Bleomycin, Adriamycin, disulfiram, cisplatin and sulfamylon and is not currently receiving any chemotherapy. o HBO contraindications of hyperbaric oxygen therapy were reviewed and the patient found to have no Upper respiratory infection and chronic sinusitis. o HBO contraindications of hyperbaric oxygen therapy were reviewed and the patient found to have no history of retinal surgery proceeding 6 weeks or intraocular gas o HBO contraindications of hyperbaric oxygen therapy were reviewed and the patient found to have no history seizure disorder or any anticonvulsant medication. o HBO contraindications of hyperbaric oxygen therapy were reviewed and the patient found  to have no septicemia with CO2 retention. o HBO contraindications of hyperbaric oxygen therapy were reviewed and the patient found to have no fever greater than 100 degrees. o HBO contraindications of hyperbaric oxygen therapy were reviewed and the patient found to have no pregnancy noted. o HBO contraindications of hyperbaric oxygen therapy were reviewed and the patient found to have no medications such as steroids or narcotics or Phenergan. Electronic Signature(s) Signed: 08/25/2016 4:24:47 PM By: Linton Ham MD [place], [place] ([id number]) Signed: 08/25/2016 5:37:17 PM By: Regan Lemming BSN, RN Previous Signature: 08/25/2016 9:43:48 AM Version By: Regan Lemming BSN, RN Entered By: Regan Lemming on 08/25/2016 09:52:55 [place], [place] ([id number]) -------------------------------------------------------------------------------- SuperBill Details Patient Name: [name] Date of Service: 08/25/2016 Medical Record Patient Account Number: [id number] Number: Treating RN: [birth year] (55 y.o. Other Clinician: Jacqulyn Bath Date of Birth/Sex: Male) Treating Dellia Nims [place] Primary Care Physician: Nicky Pugh Physician/Extender: G Referring Physician: Nicky Pugh Weeks in Treatment: 3 Diagnosis Coding ICD-10 Codes Code Description L59.8 Other specified disorders of the skin and subcutaneous tissue related to radiation Facility Procedures CPT4 Code: [id number] Description: (Facility Use Only) HBOT, full body chamber, 81min Modifier: Quantity: 4 Physician Procedures CPT4: Description Modifier Quantity Code [id number] - WC PHYS HYPERBARIC OXYGEN THERAPY 1 ICD-10 Description Diagnosis L59.8 Other specified disorders of the skin and subcutaneous tissue related to radiation Electronic Signature(s) Signed: 08/25/2016 1:38:04 PM By: Paulla Fore, RRT, CHT Signed: 08/25/2016 4:24:47 PM By: Linton Ham MD Entered By: Lorine Bears on 08/25/2016  11:59:22

## 2016-08-27 ENCOUNTER — Encounter: Payer: Medicaid Other | Admitting: Surgery

## 2016-08-27 DIAGNOSIS — C139 Malignant neoplasm of hypopharynx, unspecified: Secondary | ICD-10-CM | POA: Diagnosis not present

## 2016-08-27 NOTE — Progress Notes (Signed)
ROMANCE, ARQUETTE ([id number]) Visit Report for 08/26/2016 HBO Details Patient Name: [name], [name] Date of Service: 08/26/2016 10:00 AM Medical Record Number: [id number] Patient Account Number: [id number] Date of Birth/Sex: May 25, 1961 (55 y.o. 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 HBO Treatment Details Treatment Number: 2 Patient Type: Outpatient Chamber Type: Monoplace Chamber Serial #: [id number] Treatment Protocol: 2.0 ATA with 90 minutes oxygen, and no air breaks Treatment Details Compression Rate Down: 1.5 psi / minute De-Compression Rate Up: 1.5 psi / minute Air breaks and breathing Compress Tx Pressure periods Decompress Decompress Begins Reached (leave unused spaces Begins Ends blank) Chamber Pressure (ATA) 1 2 - - - - - - 2 1 Clock Time (24 hr) 10:02 10:12 - - - - - - 11:43 11:53 Treatment Length: 111 (minutes) Treatment Segments: 4 Capillary Blood Glucose Pre Capillary Blood Glucose (mg/dl): Post Capillary Blood Glucose (mg/dl): Vital Signs Capillary Blood Glucose Reference Range: 80 - 120 mg / dl HBO Diabetic Blood Glucose Intervention Range: <131 mg/dl or >249 mg/dl Time Vitals Blood Respiratory Capillary Blood Glucose Pulse Action Type: Pulse: Temperature: Taken: Pressure: Rate: Glucose (mg/dl): Meter #: Oximetry (%) Taken: Pre 10:15 116/68 72 16 98.6 Post 11:55 130/80 78 16 97.8 Treatment Response Treatment Completion Status: Treatment Completed without Adverse Event [name], [name] ([id number]) Treatment Notes 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 Per  Jonelle Sidle at [email or web address] this neck band is completely wrapped with kerlix gauze so that the Velcro is covered entirely for chamber safety. The patient also has a feeding tube which has been securely taped to his abdomen for safety during the treatment. HBO Attestation I certify that I supervised this HBO treatment in accordance with Medicare guidelines. A trained Yes emergency response team is readily available per hospital policies and procedures. Continue HBOT as ordered. Yes Electronic Signature(s) Signed: 08/26/2016 12:54:49 PM By: Christin Fudge MD, FACS Previous Signature: 08/26/2016 11:43:27 AM Version By: Christin Fudge MD, FACS Entered By: Christin Fudge on 08/26/2016 12:54:49 [name], [name] ([id number]) -------------------------------------------------------------------------------- HBO Safety Checklist Details Patient Name: [name] Date of Service: 08/26/2016 10:00 AM Medical Record Number: [id number] Patient Account Number: [id number] Date of Birth/Sex: 1961-10-30 (55 y.o. Male) Treating RN: Primary Care Physician: Nicky Pugh Other Clinician: Jacqulyn Bath Referring Physician: Nicky Pugh Treating Physician/Extender: Frann Rider in Treatment: 3 HBO Safety Checklist Items Safety Checklist Consent Form Signed Patient voided / foley secured and emptied When did you last eato 08/26/06:15 Last dose of injectable or oral agent n/a NA Ostomy pouch emptied and vented if applicable NA All implantable devices assessed, documented and approved NA Intravenous access site secured and place NA Valuables secured Linens and cotton and cotton/polyester blend (less than 51% polyester) Personal oil-based products / skin lotions / body lotions removed NA Wigs or hairpieces removed NA Smoking or tobacco materials removed Books / newspapers / magazines / loose paper removed Cologne, aftershave, perfume and deodorant removed Jewelry removed (may wrap wedding  band) NA Make-up removed NA Hair care products removed Battery operated devices (external) removed Heating patches and chemical warmers removed NA Titanium eyewear removed NA Nail polish cured greater than 10 hours NA Casting material cured greater than 10 hours NA Hearing aids removed NA Loose  dentures or partials removed NA Prosthetics have been removed Patient demonstrates correct use of air break device (if applicable) Patient concerns have been addressed Patient grounding bracelet on and cord attached to chamber Specifics for Inpatients (complete in addition to above) NA Medication sheet sent with patient NA Intravenous medications needed or due during therapy sent with patient [name], [name] ([id number]) NA Drainage tubes (e.g. nasogastric tube or chest tube secured and vented) completely wrapped with gauze Endotracheal or Tracheotomy tube secured to cover velcro NA Cuff deflated of air and inflated with saline NA Airway suctioned Electronic Signature(s) Signed: 08/26/2016 4:15:22 PM By: Lorine Bears RCP, RRT, CHT Entered By: Lorine Bears on 08/26/2016 10:39:26

## 2016-08-27 NOTE — Progress Notes (Signed)
VIHAN, OSTER ([id number]) Visit Report for 08/26/2016 Arrival Information Details Patient Name: [name], [name] Date of Service: 08/26/2016 10:00 AM Medical Record Number: [id number] Patient Account Number: [id number] Date of Birth/Sex: 1961/06/18 (55 y.o. Male) Treating RN: Primary Care Physician: Nicky Pugh Other Clinician: Jacqulyn Bath Referring Physician: Nicky Pugh Treating Physician/Extender: Frann Rider in Treatment: 3 Visit Information History Since Last Visit Added or deleted any medications: No Patient Arrived: Ambulatory Any new allergies or adverse reactions: No Arrival Time: 09:28 Had a fall or experienced change in No Accompanied By: [place] activities of daily living that may affect Transfer Assistance: None risk of falls: Patient Identification Verified: Yes Signs or symptoms of abuse/neglect since last No Secondary Verification Process Yes visito Completed: Hospitalized since last visit: No Patient Requires Transmission-Based No Pain Present Now: No Precautions: Patient Has Alerts: No Electronic Signature(s) Signed: 08/26/2016 4:15:22 PM By: Lorine Bears RCP, RRT, CHT Entered By: Lorine Bears on 08/26/2016 10:30:28 [name] ([id number]) -------------------------------------------------------------------------------- Encounter Discharge Information Details Patient Name: [name] Date of Service: 08/26/2016 10:00 AM Medical Record Number: [id number] Patient Account Number: [id number] Date of Birth/Sex: 06-30-1961 (55 y.o. Male) Treating RN: Primary Care Physician: Nicky Pugh Other Clinician: Jacqulyn Bath Referring Physician: Nicky Pugh Treating Physician/Extender: Frann Rider in Treatment: 3 Encounter Discharge Information Items Discharge Pain Level: 0 Discharge Condition: Stable Ambulatory Status: Ambulatory Nursing Discharge Destination: Home Transportation: Other Accompanied  By: Orland Schedule Follow-up Appointment: No Medication Reconciliation completed No and provided to Patient/Care Mariaisabel Bodiford: Clinical Summary of Care: Notes Patient will return to [facility] via [facility]. Patient has an HBO treatment scheduled on 08/27/16 at 10:00 am. Electronic Signature(s) Signed: 08/26/2016 4:15:22 PM By: Lorine Bears RCP, RRT, CHT Entered By: Lorine Bears on 08/26/2016 12:31:06 [place], [name] ([id number]) -------------------------------------------------------------------------------- Vitals Details Patient Name: [name] Date of Service: 08/26/2016 10:00 AM Medical Record Number: [id number] Patient Account Number: [id number] Date of Birth/Sex: [birth year] (55 y.o. Male) Treating RN: Primary Care Physician: Nicky Pugh Other Clinician: Jacqulyn Bath Referring Physician: Nicky Pugh Treating Physician/Extender: Frann Rider in Treatment: 3 Vital Signs Time Taken: 10:15 Temperature (F): 98.6 Height (in): 68 Pulse (bpm): 72 Weight (lbs): 115 Respiratory Rate (breaths/min): 16 Body Mass Index (BMI): 17.5 Blood Pressure (mmHg): 116/68 Reference Range: 80 - 120 mg / dl Electronic Signature(s) Signed: 08/26/2016 4:15:22 PM By: Lorine Bears RCP, RRT, CHT Entered By: Lorine Bears on 08/26/2016 10:31:39

## 2016-08-28 NOTE — Progress Notes (Signed)
LOCHLEN, REGEHR ([id number]) Visit Report for 08/27/2016 Arrival Information Details Patient Name: [name], [name] Date of Service: 08/27/2016 10:00 AM Medical Record Number: [id number] Patient Account Number: [id number] Date of Birth/Sex: [birth year] (55 y.o. Male) Treating RN: Primary Care Physician: [name] Other Clinician: [name] Referring Physician: [name] Treating Physician/Extender: [name] in Treatment: 3 Visit Information History Since Last Visit Added or deleted any medications: No Patient Arrived: Ambulatory Any new allergies or adverse reactions: No Arrival Time: 09:13 Had a fall or experienced change in No Accompanied By: [name] activities of daily living that may affect Cab risk of falls: Transfer Assistance: None Signs or symptoms of abuse/neglect since last No Patient Identification Verified: Yes visito Secondary Verification Process Yes Hospitalized since last visit: No Completed: Pain Present Now: No Patient Requires Transmission- No Based Precautions: Patient Has Alerts: No Electronic Signature(s) Signed: 08/27/2016 4:38:53 PM By: [name] RCP, RRT, CHT Entered By: [name] on 08/27/2016 09:26:09 [name] ([id number]) -------------------------------------------------------------------------------- Encounter Discharge Information Details Patient Name: [name] Date of Service: 08/27/2016 10:00 AM Medical Record Number: [id number] Patient Account Number: [id number] Date of Birth/Sex: 06-11-1961 (55 y.o. Male) Treating RN: Primary Care Physician: [name] Other Clinician: [name] Referring Physician: [name] Treating Physician/Extender: [name] in Treatment: 3 Encounter Discharge Information Items Discharge Pain Level: 0 Discharge Condition: Stable Ambulatory Status: Stable Nursing Discharge Destination: Home Transportation:  Other [place] Accompanied ByZeb [name] Schedule Follow-up Appointment: No Medication Reconciliation completed No and provided to Patient/Care [name]: Clinical Summary of Care: Notes Patient will go back to [facility] from here. Patient has an HBO treatment scheduled on 08/30/16 at 10:00 am. Electronic Signature(s) Signed: 08/27/2016 4:38:53 PM By: [name] RCP, RRT, CHT Entered By: [name] on 08/27/2016 12:28:01 [place], [name] ([id number]) -------------------------------------------------------------------------------- Vitals Details Patient Name: [name] Date of Service: 08/27/2016 10:00 AM Medical Record Number: [id number] Patient Account Number: [id number] Date of Birth/Sex: 30-May-1961 (55 y.o. Male) Treating RN: Primary Care Physician: [name] Other Clinician: [name] Referring Physician: [name] Treating Physician/Extender: [name] in Treatment: 3 Vital Signs Time Taken: 09:15 Temperature (F): 98.0 Height (in): 68 Pulse (bpm): 72 Weight (lbs): 115 Respiratory Rate (breaths/min): 16 Body Mass Index (BMI): 17.5 Blood Pressure (mmHg): 118/72 Reference Range: 80 - 120 mg / dl Electronic Signature(s) Signed: 08/27/2016 4:38:53 PM By: [name] RCP, RRT, CHT Entered By: [name], [name] on 08/27/2016 09:26:35

## 2016-08-28 NOTE — Progress Notes (Signed)
Ryan, [name] ([id number]) Visit Report for 08/27/2016 HBO Details Patient Name: [name], [name] Date of Service: 08/27/2016 10:00 AM Medical Record Number: [id number] Patient Account Number: [id number] Date of Birth/Sex: November 01, 1961 (55 y.o. 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 HBO Treatment Details Treatment Number: 3 Patient Type: Outpatient Chamber Type: Monoplace Chamber Serial #: [id number] Treatment Protocol: 2.0 ATA with 90 minutes oxygen, and no air breaks Treatment Details Compression Rate Down: 1.5 psi / minute De-Compression Rate Up: 1.5 psi / minute Air breaks and breathing Compress Tx Pressure periods Decompress Decompress Begins Reached (leave unused spaces Begins Ends blank) Chamber Pressure (ATA) 1 2 - - - - - - 2 1 Clock Time (24 hr) 10:05 10:15 - - - - - - 11:45 11:55 Treatment Length: 110 (minutes) Treatment Segments: 4 Capillary Blood Glucose Pre Capillary Blood Glucose (mg/dl): Post Capillary Blood Glucose (mg/dl): Vital Signs Capillary Blood Glucose Reference Range: 80 - 120 mg / dl HBO Diabetic Blood Glucose Intervention Range: <131 mg/dl or >249 mg/dl Time Vitals Blood Respiratory Capillary Blood Glucose Pulse Action Type: Pulse: Temperature: Taken: Pressure: Rate: Glucose (mg/dl): Meter #: Oximetry (%) Taken: Pre 09:15 118/72 72 16 98 Post 12:00 130/96 90 16 97.8 Treatment Response Treatment Completion Status: Treatment Completed without Adverse Event [place], [place] ([id number]) Treatment Notes 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 Per  Jonelle Sidle at [email or web address] this neck band is completely wrapped with kerlix gauze so that the Velcro is covered entirely for chamber safety. The patient also has a feeding tube which has been securely taped to his abdomen for safety during the treatment. HBO Attestation I certify that I supervised this HBO treatment in accordance with Medicare guidelines. A trained Yes emergency response team is readily available per hospital policies and procedures. Continue HBOT as ordered. Yes Electronic Signature(s) Signed: 08/27/2016 1:21:06 PM By: Christin Fudge MD, FACS Entered By: Christin Fudge on 08/27/2016 13:21:06 [place], [name] ([id number]) -------------------------------------------------------------------------------- HBO Safety Checklist Details Patient Name: [name] Date of Service: 08/27/2016 10:00 AM Medical Record Number: [id number] Patient Account Number: [id number] Date of Birth/Sex: 15-Nov-1961 (55 y.o. Male) Treating RN: Primary Care Physician: Nicky Pugh Other Clinician: Jacqulyn Bath Referring Physician: Nicky Pugh Treating Physician/Extender: Frann Rider in Treatment: 3 HBO Safety Checklist Items Safety Checklist Consent Form Signed Patient voided / foley secured and emptied When did you last eato 07:30 am (tube feeding) Last dose of injectable or oral agent n/a NA Ostomy pouch emptied and vented if applicable NA All implantable devices assessed, documented and approved NA Intravenous access site secured and place Valuables secured Linens and cotton and cotton/polyester blend (less than 51% polyester) Personal oil-based products / skin lotions / body lotions removed NA Wigs or hairpieces removed NA Smoking or tobacco materials removed Books / newspapers / magazines / loose paper removed Cologne, aftershave, perfume and deodorant removed Jewelry removed (may wrap wedding band) NA Make-up removed Hair care products removed Battery operated  devices (external) removed Heating patches and chemical warmers removed NA Titanium eyewear removed NA Nail polish cured greater than 10 hours NA Casting material cured greater than 10 hours NA Hearing aids removed NA Loose dentures or partials removed NA Prosthetics have been removed Patient  demonstrates correct use of air break device (if applicable) Patient concerns have been addressed Patient grounding bracelet on and cord attached to chamber Specifics for Inpatients (complete in addition to above) NA Medication sheet sent with patient NA Intravenous medications needed or due during therapy sent with patient ARISTEDE, BUCHMANN ([id number]) NA Drainage tubes (e.g. nasogastric tube or chest tube secured and vented) Endotracheal or Tracheotomy tube secured velcro ties completely wrapped NA Cuff deflated of air and inflated with saline NA Airway suctioned Electronic Signature(s) Signed: 08/27/2016 4:38:53 PM By: Lorine Bears RCP, RRT, CHT Entered By: Becky Sax, Amado Nash on 08/27/2016 [id number]

## 2016-08-30 ENCOUNTER — Encounter: Payer: Medicaid Other | Admitting: Surgery

## 2016-08-30 DIAGNOSIS — C139 Malignant neoplasm of hypopharynx, unspecified: Secondary | ICD-10-CM | POA: Diagnosis not present

## 2016-08-30 NOTE — Progress Notes (Signed)
SHADEN, HAUGHT ([id number]) Visit Report for 08/30/2016 HBO Details Patient Name: [name], [name] Date of Service: 08/30/2016 10:30 AM Medical Record Number: [id number] Patient Account Number: [id number] Date of Birth/Sex: [birth year] (55 y.o. 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 HBO Treatment Details Treatment Number: 4 Patient Type: Outpatient Chamber Type: Monoplace Chamber Serial #: [id number] Treatment Protocol: 2.0 ATA with 90 minutes oxygen, and no air breaks Treatment Details Compression Rate Down: 1.5 psi / minute De-Compression Rate Up: 1.5 psi / minute Air breaks and breathing Compress Tx Pressure periods Decompress Decompress Begins Reached (leave unused spaces Begins Ends blank) Chamber Pressure (ATA) 1 2 - - - - - - 2 1 Clock Time (24 hr) 10:04 10:14 - - - - - - 11:44 11:55 Treatment Length: 111 (minutes) Treatment Segments: 4 Capillary Blood Glucose Pre Capillary Blood Glucose (mg/dl): Post Capillary Blood Glucose (mg/dl): Vital Signs Capillary Blood Glucose Reference Range: 80 - 120 mg / dl HBO Diabetic Blood Glucose Intervention Range: <131 mg/dl or >249 mg/dl Time Vitals Blood Respiratory Capillary Blood Glucose Pulse Action Type: Pulse: Temperature: Taken: Pressure: Rate: Glucose (mg/dl): Meter #: Oximetry (%) Taken: Pre 09:20 126/76 90 16 98 Post 12:00 128/62 90 16 97.6 Treatment Response Treatment Completion Status: Treatment Completed without Adverse Event Antonio, Levie ([id number]) HBO Attestation I certify that I supervised this HBO treatment in accordance with Medicare guidelines. A  trained Yes emergency response team is readily available per hospital policies and procedures. Continue HBOT as ordered. Yes Electronic Signature(s) Signed: 08/30/2016 12:13:15 PM By: Christin Fudge MD, FACS Entered By: Christin Fudge on 08/30/2016 12:13:15 [place], [name] ([id number]) -------------------------------------------------------------------------------- HBO Safety Checklist Details Patient Name: [name] Date of Service: 08/30/2016 10:30 AM Medical Record Number: [id number] Patient Account Number: [id number] Date of Birth/Sex: January 08, 1961 (55 y.o. Male) Treating RN: Primary Care Physician: Nicky Pugh Other Clinician: Jacqulyn Bath Referring Physician: Nicky Pugh Treating Physician/Extender: Frann Rider in Treatment: 3 HBO Safety Checklist Items Safety Checklist Consent Form Signed Patient voided / foley secured and emptied When did you last eato 08:00 am (tube feeding) Last dose of injectable or oral agent n/a NA Ostomy pouch emptied and vented if applicable NA All implantable devices assessed, documented and approved NA Intravenous access site secured and place Valuables secured Linens and cotton and cotton/polyester blend (less than 51% polyester) Personal oil-based products / skin lotions / body lotions removed NA Wigs or hairpieces removed NA Smoking or tobacco materials removed Books / newspapers / magazines / loose paper removed Cologne, aftershave, perfume and deodorant removed Jewelry removed (may wrap wedding band) NA Make-up removed Hair care products removed Battery operated devices (external) removed Heating patches and chemical warmers removed NA Titanium eyewear removed NA Nail polish cured greater than 10 hours NA Casting material cured greater than 10 hours NA Hearing aids removed NA Loose dentures or partials removed NA Prosthetics have been removed Patient demonstrates correct use of air break device (if applicable) Patient  concerns have been addressed Patient grounding bracelet on and cord attached to chamber Specifics for Inpatients (complete in addition to above) NA Medication sheet sent with patient NA Intravenous medications needed or due during therapy sent with patient DARRNELL, KIPPS ([id number]) NA Drainage tubes (e.g. nasogastric tube or chest tube  secured and vented) neck band wrapped with kerlix Endotracheal or Tracheotomy tube secured gauze to cover velcro NA Cuff deflated of air and inflated with saline NA Airway suctioned Electronic Signature(s) Signed: 08/30/2016 3:38:01 PM By: Lorine Bears RCP, RRT, CHT Entered By: Lorine Bears on 08/30/2016 09:30:46

## 2016-08-30 NOTE — Progress Notes (Signed)
JOHNMATTHEW, PRESTO ([id number]) Visit Report for 08/30/2016 Arrival Information Details Patient Name: [name], [name] Date of Service: 08/30/2016 10:30 AM Medical Record Number: [id number] Patient Account Number: [id number] Date of Birth/Sex: 1961/04/15 (55 y.o. Male) Treating RN: Primary Care Physician: Nicky Pugh Other Clinician: Jacqulyn Bath Referring Physician: Nicky Pugh Treating Physician/Extender: Frann Rider in Treatment: 3 Visit Information History Since Last Visit Added or deleted any medications: No Patient Arrived: Ambulatory Any new allergies or adverse reactions: No Arrival Time: 09:20 Had a fall or experienced change in No Accompanied By: [name] activities of daily living that may affect Cab risk of falls: Transfer Assistance: None Signs or symptoms of abuse/neglect since last No Patient Identification Verified: Yes visito Secondary Verification Process Yes Hospitalized since last visit: No Completed: Pain Present Now: No Patient Requires Transmission- No Based Precautions: Patient Has Alerts: No Electronic Signature(s) Signed: 08/30/2016 3:38:01 PM By: Lorine Bears RCP, RRT, CHT Entered By: Lorine Bears on 08/30/2016 09:27:28 [name] ([id number]) -------------------------------------------------------------------------------- Encounter Discharge Information Details Patient Name: [name] Date of Service: 08/30/2016 10:30 AM Medical Record Number: [id number] Patient Account Number: [id number] Date of Birth/Sex: 25-Jun-1961 (55 y.o. Male) Treating RN: Primary Care Physician: Nicky Pugh Other Clinician: Jacqulyn Bath Referring Physician: Nicky Pugh Treating Physician/Extender: Frann Rider in Treatment: 3 Encounter Discharge Information Items Discharge Pain Level: 0 Discharge Condition: Stable Ambulatory Status: Ambulatory Nursing Discharge Destination: Home Transportation: Other Gaffer Accompanied By: [name] Schedule Follow-up Appointment: No Medication Reconciliation completed No and provided to Patient/Care [name]: Clinical Summary of Care: Notes Patient will go back to [facility] from here. Patient has an HBO treatment scheduled on 08/31/16 at 10:00 am. Electronic Signature(s) Signed: 08/30/2016 3:38:01 PM By: Lorine Bears RCP, RRT, CHT Entered By: Lorine Bears on 08/30/2016 12:04:51 [place], [place] ([id number]) -------------------------------------------------------------------------------- Vitals Details Patient Name: [name] Date of Service: 08/30/2016 10:30 AM Medical Record Number: [id number] Patient Account Number: [id number] Date of Birth/Sex: January 10, 1961 (55 y.o. Male) Treating RN: Primary Care Physician: Nicky Pugh Other Clinician: Jacqulyn Bath Referring Physician: Nicky Pugh Treating Physician/Extender: Frann Rider in Treatment: 3 Vital Signs Time Taken: 09:20 Temperature (F): 98.0 Height (in): 68 Pulse (bpm): 90 Weight (lbs): 115 Respiratory Rate (breaths/min): 16 Body Mass Index (BMI): 17.5 Blood Pressure (mmHg): 126/76 Reference Range: 80 - 120 mg / dl Electronic Signature(s) Signed: 08/30/2016 3:38:01 PM By: Lorine Bears RCP, RRT, CHT Entered By: Becky Sax, Amado Nash on 08/30/2016 [id number]

## 2016-08-31 ENCOUNTER — Encounter: Payer: Medicaid Other | Admitting: Internal Medicine

## 2016-08-31 DIAGNOSIS — C139 Malignant neoplasm of hypopharynx, unspecified: Secondary | ICD-10-CM | POA: Diagnosis not present

## 2016-08-31 NOTE — Progress Notes (Signed)
JARVON, SCHOPP ([id number]) Visit Report for 08/31/2016 Arrival Information Details Patient Name: Ryan, [name] Date of Service: 08/31/2016 10:00 AM Medical Record Patient Account Number: [id number] Number: Treating RN: 23-Sep-1961 (55 y.o. Other Clinician: Jacqulyn Bath Date of Birth/Sex: Male) Treating ROBSON, MICHAEL Primary Care Physician/Extender: Orland Dec, ERNEST Physician: Referring Physician: Milana Na in Treatment: 4 Visit Information History Since Last Visit Added or deleted any medications: No Patient Arrived: Ambulatory Any new allergies or adverse reactions: No Arrival Time: 09:20 Had a fall or experienced change in No Accompanied By: [name] activities of daily living that may affect Cab risk of falls: Transfer Assistance: None Signs or symptoms of abuse/neglect since last No Patient Identification Verified: Yes visito Secondary Verification Process Yes Hospitalized since last visit: No Completed: Pain Present Now: No Patient Requires Transmission- No Based Precautions: Patient Has Alerts: No Electronic Signature(s) Signed: 08/31/2016 12:42:14 PM By: Lorine Bears RCP, RRT, CHT Entered By: Lorine Bears on 08/31/2016 09:30:27 [place], [place] ([id number]) -------------------------------------------------------------------------------- Encounter Discharge Information Details Patient Name: [name] Date of Service: 08/31/2016 10:00 AM Medical Record Patient Account Number: [id number] Number: Treating RN: 1961/09/10 (55 y.o. Other Clinician: Jacqulyn Bath Date of Birth/Sex: Male) Treating ROBSON, MICHAEL Primary Care Physician/Extender: Orland Dec, ERNEST Physician: Referring Physician: Milana Na in Treatment: 4 Encounter Discharge Information Items Discharge Pain Level: 0 Discharge Condition: Stable Ambulatory Status: Ambulatory Other (Note Discharge  Destination: Required) Transportation: Other [name] Accompanied By: [name] Schedule Follow-up Appointment: No Medication Reconciliation completed and provided to Patient/Care No Dannie Woolen: Clinical Summary of Care: Notes Patient has an HBO treatment scheduled on 09/01/16 at 10:00 am. Electronic Signature(s) Signed: 08/31/2016 12:42:14 PM By: Lorine Bears RCP, RRT, CHT Entered By: Lorine Bears on 08/31/2016 12:41:42 [place], [place] ([id number]) -------------------------------------------------------------------------------- Vitals Details Patient Name: [name] Date of Service: 08/31/2016 10:00 AM Medical Record Patient Account Number: [id number] Number: Treating RN: 03-13-1961 (55 y.o. Other Clinician: Jacqulyn Bath Date of Birth/Sex: Male) Treating ROBSON, MICHAEL Primary Care Physician/Extender: Orland Dec, ERNEST Physician: Referring Physician: Nicky Pugh Weeks in Treatment: 4 Vital Signs Time Taken: 09:20 Temperature (F): 98.1 Height (in): 68 Pulse (bpm): 90 Weight (lbs): 115 Respiratory Rate (breaths/min): 16 Body Mass Index (BMI): 17.5 Blood Pressure (mmHg): 118/82 Reference Range: 80 - 120 mg / dl Electronic Signature(s) Signed: 08/31/2016 12:42:14 PM By: Lorine Bears RCP, RRT, CHT Entered By: Lorine Bears on 08/31/2016 09:30:56

## 2016-09-01 ENCOUNTER — Encounter: Payer: Medicaid Other | Admitting: Internal Medicine

## 2016-09-01 DIAGNOSIS — C139 Malignant neoplasm of hypopharynx, unspecified: Secondary | ICD-10-CM | POA: Diagnosis not present

## 2016-09-01 NOTE — Progress Notes (Signed)
[name], [name] ([id number]) Visit Report for 08/31/2016 HBO Details Patient Name: [name], [name] Date of Service: 08/31/2016 10:00 AM Medical Record Patient Account Number: [id number] Number: Treating RN: 06-24-1961 (55 y.o. 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 HBO Treatment Details Treatment Number: 5 Patient Type: Outpatient Chamber Type: Monoplace Chamber Serial #: [id number] Treatment Protocol: 2.0 ATA with 90 minutes oxygen, and no air breaks Treatment Details Compression Rate Down: 1.5 psi / minute De-Compression Rate Up: 1.5 psi / minute Air breaks and breathing Compress Tx Pressure periods Decompress Decompress Begins Reached (leave unused spaces Begins Ends blank) Chamber Pressure (ATA) 1 2 - - - - - - 2 1 Clock Time (24 hr) 10:13 10:23 - - - - - - 11:53 12:04 Treatment Length: 111 (minutes) Treatment Segments: 4 Capillary Blood Glucose Pre Capillary Blood Glucose (mg/dl): Post Capillary Blood Glucose (mg/dl): Vital Signs Capillary Blood Glucose Reference Range: 80 - 120 mg / dl HBO Diabetic Blood Glucose Intervention Range: <131 mg/dl or >249 mg/dl Time Vitals Blood Respiratory Capillary Blood Glucose Pulse Action Type: Pulse: Temperature: Taken: Pressure: Rate: Glucose (mg/dl): Meter #: Oximetry (%) Taken: Pre 09:20 118/82 90 16 98.1 Post 12:08 130/82 84 16 98.4 [name], [name] ([id number]) Treatment Response Treatment Completion Status: Treatment Completed without Adverse Event Physician Notes no concerns with treatment given HBO Attestation I certify that I supervised this HBO  treatment in accordance with Medicare guidelines. A trained Yes emergency response team is readily available per hospital policies and procedures. Continue HBOT as ordered. Yes Electronic Signature(s) Signed: 09/01/2016 8:02:21 AM By: Linton Ham MD Previous Signature: 08/31/2016 12:42:14 PM Version By: Lorine Bears RCP, RRT, CHT Entered By: Linton Ham on 08/31/2016 13:26:12 [name] ([id number]) -------------------------------------------------------------------------------- HBO Safety Checklist Details Patient Name: [name] Date of Service: 08/31/2016 10:00 AM Medical Record Patient Account Number: [id number] Number: Treating RN: [birth year] (55 y.o. Other Clinician: Jacqulyn Bath Date of Birth/Sex: Male) Treating ROBSON, MICHAEL Primary Care Physician/Extender: Orland Dec, ERNEST Physician: Referring Physician: Nicky Pugh Weeks in Treatment: 4 HBO Safety Checklist Items Safety Checklist Consent Form Signed Patient voided / foley secured and emptied When did you last eato 22:00 on 08/30/16 Last dose of injectable or oral agent n/a NA Ostomy pouch emptied and vented if applicable NA All implantable devices assessed, documented and approved NA Intravenous access site secured and place NA Valuables secured Linens and cotton and cotton/polyester blend (less than 51% polyester) Personal oil-based products / skin lotions / body lotions removed NA Wigs or hairpieces removed NA Smoking or tobacco materials removed Books / newspapers / magazines / loose paper removed Cologne, aftershave, perfume and deodorant removed Jewelry removed (may wrap wedding band) NA Make-up removed Hair care products removed Battery operated devices (external) removed Heating patches and chemical warmers removed NA Titanium eyewear removed NA Nail polish cured greater than 10 hours NA Casting material cured greater than 10 hours NA Hearing aids removed NA  Loose dentures or partials removed NA Prosthetics have been removed Patient demonstrates correct use of air break device (if applicable) Patient concerns have been addressed Patient grounding bracelet on and cord attached to chamber Specifics for Inpatients (complete in addition to above) [name], [name] ([id number]) Medication sheet sent with patient  NA Intravenous medications needed or due during therapy sent with patient NA Drainage tubes (e.g. nasogastric tube or chest tube secured and vented) neck band wrap to completely Endotracheal or Tracheotomy tube secured cover velcro NA Cuff deflated of air and inflated with saline NA Airway suctioned Electronic Signature(s) Signed: 08/31/2016 12:42:14 PM By: Lorine Bears RCP, RRT, CHT Entered By: Lorine Bears on 08/31/2016 09:32:43

## 2016-09-02 ENCOUNTER — Encounter: Payer: Medicaid Other | Admitting: Surgery

## 2016-09-02 DIAGNOSIS — C139 Malignant neoplasm of hypopharynx, unspecified: Secondary | ICD-10-CM | POA: Diagnosis not present

## 2016-09-02 NOTE — Progress Notes (Signed)
[name], [name] ([id number]) Visit Report for 09/01/2016 Arrival Information Details Patient Name: [name], [name] Date of Service: 09/01/2016 10:00 AM Medical Record Patient Account Number: [id number] Number: Treating RN: 09/02/1961 (55 y.o. Other Clinician: Jacqulyn Bath Date of Birth/Sex: Male) Treating ROBSON, MICHAEL Primary Care Physician/Extender: Orland Dec, ERNEST Physician: Referring Physician: Milana Na in Treatment: 4 Visit Information History Since Last Visit Added or deleted any medications: No Patient Arrived: Ambulatory Any new allergies or adverse reactions: No Arrival Time: 10:15 Had a fall or experienced change in No Accompanied By: [name] activities of daily living that may affect Cab risk of falls: Transfer Assistance: None Signs or symptoms of abuse/neglect since last No Patient Identification Verified: Yes visito Secondary Verification Process Yes Hospitalized since last visit: No Completed: Pain Present Now: No Patient Requires Transmission- No Based Precautions: Patient Has Alerts: No Electronic Signature(s) Signed: 09/01/2016 3:14:04 PM By: Lorine Bears RCP, RRT, CHT Entered By: Lorine Bears on 09/01/2016 [place], [place] ([id number]) -------------------------------------------------------------------------------- Encounter Discharge Information Details Patient Name: [name] Date of Service: 09/01/2016 10:00 AM Medical Record Patient Account Number: [id number] Number: Treating RN: [birth year] (55 y.o. Other Clinician: Jacqulyn Bath Date of Birth/Sex: Male) Treating ROBSON, MICHAEL Primary Care Physician/Extender: Orland Dec, ERNEST Physician: Referring Physician: Milana Na in Treatment: 4 Encounter Discharge Information Items Discharge Pain Level: 0 Discharge Condition: Stable Ambulatory Status: Ambulatory Nursing Discharge Destination: Home Transportation:  Other Catering manager Accompanied By: [name] Schedule Follow-up Appointment: No Medication Reconciliation completed No and provided to Patient/Care Mitzie Marlar: Clinical Summary of Care: Notes Patient has an HBO treatment scheduled on 09/02/16 at 10:00 am. Electronic Signature(s) Signed: 09/01/2016 3:14:04 PM By: Lorine Bears RCP, RRT, CHT Entered By: Lorine Bears on 09/01/2016 12:44:18 [place], [employer] ([id number]) -------------------------------------------------------------------------------- Vitals Details Patient Name: [name] Date of Service: 09/01/2016 10:00 AM Medical Record Patient Account Number: [id number] Number: Treating RN: 16-Nov-1961 (55 y.o. Other Clinician: Jacqulyn Bath Date of Birth/Sex: Male) Treating ROBSON, MICHAEL Primary Care Physician/Extender: Orland Dec, ERNEST Physician: Referring Physician: Nicky Pugh Weeks in Treatment: 4 Vital Signs Time Taken: 10:25 Temperature (F): 98.4 Height (in): 68 Pulse (bpm): 84 Weight (lbs): 115 Respiratory Rate (breaths/min): 16 Body Mass Index (BMI): 17.5 Blood Pressure (mmHg): 110/78 Reference Range: 80 - 120 mg / dl Electronic Signature(s) Signed: 09/01/2016 3:14:04 PM By: Lorine Bears RCP, RRT, CHT Entered By: Lorine Bears on 09/01/2016 11:02:32

## 2016-09-03 ENCOUNTER — Encounter: Payer: Medicaid Other | Admitting: Surgery

## 2016-09-03 DIAGNOSIS — C139 Malignant neoplasm of hypopharynx, unspecified: Secondary | ICD-10-CM | POA: Diagnosis not present

## 2016-09-03 NOTE — Progress Notes (Signed)
KARY, FRONTINO ([id number]) Visit Report for 09/02/2016 HBO Details Patient Name: [name], [name] Date of Service: 09/02/2016 10:00 AM Medical Record Number: [id number] Patient Account Number: [id number] Date of Birth/Sex: 07-03-1961 (55 y.o. 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 HBO Treatment Details Treatment Number: 7 Patient Type: Outpatient Chamber Type: Monoplace Chamber Serial #: [id number] Treatment Protocol: 2.0 ATA with 90 minutes oxygen, and no air breaks Treatment Details Compression Rate Down: 1.5 psi / minute De-Compression Rate Up: 1.5 psi / minute Air breaks and breathing Compress Tx Pressure periods Decompress Decompress Begins Reached (leave unused spaces Begins Ends blank) Chamber Pressure (ATA) 1 2 - - - - - - 2 1 Clock Time (24 hr) 10:04 10:15 - - - - - - 11:45 11:55 Treatment Length: 111 (minutes) Treatment Segments: 4 Capillary Blood Glucose Pre Capillary Blood Glucose (mg/dl): Post Capillary Blood Glucose (mg/dl): Vital Signs Capillary Blood Glucose Reference Range: 80 - 120 mg / dl HBO Diabetic Blood Glucose Intervention Range: <131 mg/dl or >249 mg/dl Time Vitals Blood Respiratory Capillary Blood Glucose Pulse Action Type: Pulse: Temperature: Taken: Pressure: Rate: Glucose (mg/dl): Meter #: Oximetry (%) Taken: Pre 09:25 116/68 90 16 98.1 Post 12:00 122/78 72 16 98 Treatment Response Treatment Completion Status: Treatment Completed without Adverse Event [place], [place] ([id number]) HBO Attestation I certify that I supervised this HBO treatment in accordance with Medicare guidelines. A  trained Yes emergency response team is readily available per hospital policies and procedures. Continue HBOT as ordered. Yes Electronic Signature(s) Signed: 09/02/2016 12:56:34 PM By: Christin Fudge MD, FACS Entered By: Christin Fudge on 09/02/2016 12:56:34 [place], [place] ([id number]) -------------------------------------------------------------------------------- HBO Safety Checklist Details Patient Name: [name] Date of Service: 09/02/2016 10:00 AM Medical Record Number: [id number] Patient Account Number: [id number] Date of Birth/Sex: [birth year] (55 y.o. Male) Treating RN: Primary Care Physician: Nicky Pugh Other Clinician: Jacqulyn Bath Referring Physician: Nicky Pugh Treating Physician/Extender: Frann Rider in Treatment: 4 HBO Safety Checklist Items Safety Checklist Consent Form Signed Patient voided / foley secured and emptied When did you last eato 07:00 am (tube feeding) Last dose of injectable or oral agent n/a NA Ostomy pouch emptied and vented if applicable NA All implantable devices assessed, documented and approved NA Intravenous access site secured and place Valuables secured Linens and cotton and cotton/polyester blend (less than 51% polyester) Personal oil-based products / skin lotions / body lotions removed NA Wigs or hairpieces removed NA Smoking or tobacco materials removed Books / newspapers / magazines / loose paper removed Cologne, aftershave, perfume and deodorant removed Jewelry removed (may wrap wedding band) NA Make-up removed Hair care products removed Battery operated devices (external) removed Heating patches and chemical warmers removed NA Titanium eyewear removed NA Nail polish cured greater than 10 hours NA Casting material cured greater than 10 hours NA Hearing aids removed NA Loose dentures or partials removed NA Prosthetics have been removed Patient demonstrates correct use of air break device (if applicable) Patient  concerns have been addressed Patient grounding bracelet on and cord attached to chamber Specifics for Inpatients (complete in addition to above) NA Medication sheet sent with patient NA Intravenous medications needed or due during therapy sent with patient DUNTE, BLINN ([id number]) NA Drainage tubes (e.g. nasogastric tube or chest tube  secured and vented) neck band completely wrapped Endotracheal or Tracheotomy tube secured with kerlix gauze to cover velcro completely NA Cuff deflated of air and inflated with saline NA Airway suctioned Electronic Signature(s) Signed: 09/02/2016 3:35:39 PM By: Lorine Bears RCP, RRT, CHT Entered By: Lorine Bears on 09/02/2016 09:35:38

## 2016-09-03 NOTE — Progress Notes (Signed)
ELISANDRO, WYKA ([id number]) Visit Report for 09/02/2016 Arrival Information Details Patient Name: [name], [name] Date of Service: 09/02/2016 10:00 AM Medical Record Number: [id number] Patient Account Number: [id number] Date of Birth/Sex: 1961-11-22 (55 y.o. Male) Treating RN: Primary Care Physician: Nicky Pugh Other Clinician: Jacqulyn Bath Referring Physician: Nicky Pugh Treating Physician/Extender: Frann Rider in Treatment: 4 Visit Information History Since Last Visit Added or deleted any medications: No Patient Arrived: Ambulatory Any new allergies or adverse reactions: No Arrival Time: 09:25 Had a fall or experienced change in No Accompanied By: [name] activities of daily living that may affect Cab risk of falls: Transfer Assistance: None Signs or symptoms of abuse/neglect since last No Patient Identification Verified: Yes visito Secondary Verification Process Yes Hospitalized since last visit: No Completed: Pain Present Now: No Patient Requires Transmission- No Based Precautions: Patient Has Alerts: No Electronic Signature(s) Signed: 09/02/2016 3:35:39 PM By: Lorine Bears RCP, RRT, CHT Entered By: Lorine Bears on 09/02/2016 09:33:33 [place], [place] ([id number]) -------------------------------------------------------------------------------- Encounter Discharge Information Details Patient Name: [name] Date of Service: 09/02/2016 10:00 AM Medical Record Number: [id number] Patient Account Number: [id number] Date of Birth/Sex: 02/05/1961 (55 y.o. Male) Treating RN: Primary Care Physician: Nicky Pugh Other Clinician: Jacqulyn Bath Referring Physician: Nicky Pugh Treating Physician/Extender: Frann Rider in Treatment: 4 Encounter Discharge Information Items Discharge Pain Level: 0 Discharge Condition: Stable Ambulatory Status: Ambulatory Nursing Discharge Destination: Home Transportation: Other Gaffer Accompanied By: [name] Schedule Follow-up Appointment: No Medication Reconciliation completed No and provided to Patient/Care [name]: Clinical Summary of Care: Notes Patient has an HBO treatment scheduled on 98/29/17 at 10:00 a.m. Electronic Signature(s) Signed: 09/02/2016 3:35:39 PM By: Lorine Bears RCP, RRT, CHT Entered By: Lorine Bears on 09/02/2016 12:15:45 [place], [place] ([id number]) -------------------------------------------------------------------------------- Vitals Details Patient Name: [name] Date of Service: 09/02/2016 10:00 AM Medical Record Number: [id number] Patient Account Number: [id number] Date of Birth/Sex: 07-02-1961 (55 y.o. Male) Treating RN: Primary Care Physician: Nicky Pugh Other Clinician: Jacqulyn Bath Referring Physician: Nicky Pugh Treating Physician/Extender: Frann Rider in Treatment: 4 Vital Signs Time Taken: 09:25 Temperature (F): 98.1 Height (in): 68 Pulse (bpm): 90 Weight (lbs): 115 Respiratory Rate (breaths/min): 16 Body Mass Index (BMI): 17.5 Blood Pressure (mmHg): 116/68 Reference Range: 80 - 120 mg / dl Electronic Signature(s) Signed: 09/02/2016 3:35:39 PM By: Lorine Bears RCP, RRT, CHT Entered By: Lorine Bears on 09/02/2016 09:34:05

## 2016-09-04 NOTE — Progress Notes (Signed)
KEIGO, SCHIMPF ([id number]) Visit Report for 09/03/2016 HBO Details Patient Name: [name], [name] Date of Service: 09/03/2016 10:00 AM Medical Record Number: [id number] Patient Account Number: [id number] Date of Birth/Sex: [birth year] (55 y.o. 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 HBO Treatment Details Treatment Number: 8 Patient Type: Outpatient Chamber Type: Monoplace Chamber Serial #: [id number] Treatment Protocol: 2.0 ATA with 90 minutes oxygen, and no air breaks Treatment Details Compression Rate Down: 1.5 psi / minute De-Compression Rate Up: 1.5 psi / minute Air breaks and breathing Compress Tx Pressure periods Decompress Decompress Begins Reached (leave unused spaces Begins Ends blank) Chamber Pressure (ATA) 1 2 - - - - - - 2 1 Clock Time (24 hr) 10:02 10:12 - - - - - - 11:42 11:52 Treatment Length: 110 (minutes) Treatment Segments: 4 Capillary Blood Glucose Pre Capillary Blood Glucose (mg/dl): Post Capillary Blood Glucose (mg/dl): Vital Signs Capillary Blood Glucose Reference Range: 80 - 120 mg / dl HBO Diabetic Blood Glucose Intervention Range: <131 mg/dl or >249 mg/dl Time Vitals Blood Respiratory Capillary Blood Glucose Pulse Action Type: Pulse: Temperature: Taken: Pressure: Rate: Glucose (mg/dl): Meter #: Oximetry (%) Taken: Pre 09:25 112/78 84 16 98.2 Treatment Response Treatment Completion Status: Treatment Completed without Adverse Event [name], [name] ([id number]) HBO Attestation I certify that I supervised this HBO treatment in accordance with Medicare guidelines. A trained Yes emergency response  team is readily available per hospital policies and procedures. Continue HBOT as ordered. Yes Electronic Signature(s) Signed: 09/03/2016 12:02:43 PM By: Christin Fudge MD, FACS Entered By: Christin Fudge on 09/03/2016 12:02:43 [name], [name] ([id number]) -------------------------------------------------------------------------------- HBO Safety Checklist Details Patient Name: [name] Date of Service: 09/03/2016 10:00 AM Medical Record Number: [id number] Patient Account Number: [id number] Date of Birth/Sex: 1961-05-19 (55 y.o. Male) Treating RN: Primary Care Physician: Nicky Pugh Other Clinician: Jacqulyn Bath Referring Physician: Nicky Pugh Treating Physician/Extender: Frann Rider in Treatment: 4 HBO Safety Checklist Items Safety Checklist Consent Form Signed Patient voided / foley secured and emptied When did you last eato 07:00 am (tube feeding) Last dose of injectable or oral agent n/a NA Ostomy pouch emptied and vented if applicable NA All implantable devices assessed, documented and approved NA Intravenous access site secured and place Valuables secured Linens and cotton and cotton/polyester blend (less than 51% polyester) Personal oil-based products / skin lotions / body lotions removed NA Wigs or hairpieces removed NA Smoking or tobacco materials removed Books / newspapers / magazines / loose paper removed Cologne, aftershave, perfume and deodorant removed Jewelry removed (may wrap wedding band) NA Make-up removed Hair care products removed Battery operated devices (external) removed Heating patches and chemical warmers removed NA Titanium eyewear removed NA Nail polish cured greater than 10 hours NA Casting material cured greater than 10 hours NA Hearing aids removed NA Loose dentures or partials removed NA Prosthetics have been removed Patient demonstrates correct use of air break device (if applicable) Patient concerns have been  addressed Patient grounding bracelet on and cord attached to chamber Specifics for Inpatients (complete in addition to above) NA Medication sheet sent with patient NA Intravenous medications needed or due during therapy sent with patient [name], [name] ([id number]) NA Drainage tubes (e.g. nasogastric tube or chest tube secured and vented) neck band completely  wrapped Endotracheal or Tracheotomy tube secured with kerlix gauze to cover velcro Cuff deflated of air and inflated with saline NA Airway suctioned Electronic Signature(s) Signed: 09/03/2016 3:24:38 PM By: Lorine Bears RCP, RRT, CHT Entered By: Lorine Bears on 09/03/2016 09:34:52

## 2016-09-04 NOTE — Progress Notes (Signed)
Ryan, [name] ([id number]) Visit Report for 09/03/2016 Arrival Information Details Patient Name: [name], [name] Date of Service: 09/03/2016 10:00 AM Medical Record Number: [id number] Patient Account Number: [id number] Date of Birth/Sex: 1961-02-15 (55 y.o. Male) Treating RN: Primary Care Physician: Nicky Pugh Other Clinician: Jacqulyn Bath Referring Physician: Nicky Pugh Treating Physician/Extender: Frann Rider in Treatment: 4 Visit Information History Since Last Visit Added or deleted any medications: No Patient Arrived: Ambulatory Any new allergies or adverse reactions: No Arrival Time: 09:25 Had a fall or experienced change in No Accompanied By: [name] activities of daily living that may affect Cab risk of falls: Transfer Assistance: None Signs or symptoms of abuse/neglect since last No Patient Identification Verified: Yes visito Secondary Verification Process Yes Hospitalized since last visit: No Completed: Pain Present Now: No Patient Requires Transmission- No Based Precautions: Patient Has Alerts: No Electronic Signature(s) Signed: 09/03/2016 3:24:38 PM By: Lorine Bears RCP, RRT, CHT Entered By: Lorine Bears on 09/03/2016 09:32:48 [place], [place] ([id number]) -------------------------------------------------------------------------------- Vitals Details Patient Name: [name] Date of Service: 09/03/2016 10:00 AM Medical Record Number: [id number] Patient Account Number: [id number] Date of Birth/Sex: 08-Feb-1961 (55 y.o. Male) Treating RN: Primary Care Physician: Nicky Pugh Other Clinician: Jacqulyn Bath Referring Physician: Nicky Pugh Treating Physician/Extender: Frann Rider in Treatment: 4 Vital Signs Time Taken: 09:25 Temperature (F): 98.2 Height (in): 68 Pulse (bpm): 84 Weight (lbs): 115 Respiratory Rate (breaths/min): 16 Body Mass Index (BMI): 17.5 Blood Pressure (mmHg): 112/78 Reference  Range: 80 - 120 mg / dl Electronic Signature(s) Signed: 09/03/2016 3:24:38 PM By: Lorine Bears RCP, RRT, CHT Entered By: Lorine Bears on 09/03/2016 09:33:11

## 2016-09-06 ENCOUNTER — Encounter: Payer: Medicaid Other | Attending: Nurse Practitioner | Admitting: Nurse Practitioner

## 2016-09-06 DIAGNOSIS — Y842 Radiological procedure and radiotherapy as the cause of abnormal reaction of the patient, or of later complication, without mention of misadventure at the time of the procedure: Secondary | ICD-10-CM | POA: Insufficient documentation

## 2016-09-06 DIAGNOSIS — Z930 Tracheostomy status: Secondary | ICD-10-CM | POA: Insufficient documentation

## 2016-09-06 DIAGNOSIS — L598 Other specified disorders of the skin and subcutaneous tissue related to radiation: Secondary | ICD-10-CM | POA: Insufficient documentation

## 2016-09-06 DIAGNOSIS — Z931 Gastrostomy status: Secondary | ICD-10-CM | POA: Diagnosis not present

## 2016-09-06 DIAGNOSIS — C139 Malignant neoplasm of hypopharynx, unspecified: Secondary | ICD-10-CM | POA: Insufficient documentation

## 2016-09-06 DIAGNOSIS — E43 Unspecified severe protein-calorie malnutrition: Secondary | ICD-10-CM | POA: Insufficient documentation

## 2016-09-06 DIAGNOSIS — Z87891 Personal history of nicotine dependence: Secondary | ICD-10-CM | POA: Diagnosis not present

## 2016-09-06 NOTE — Progress Notes (Signed)
[name] [name] [name] [name] ([id number]) Visit Report for 09/06/2016 HBO Details Patient Name: [name] [name] [name] [name] Date of Service: 09/06/2016 10:00 AM Medical Record Number: [id number] Patient Account Number: [id number] Date of Birth/Sex: [birth year] (55 y.o. 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 HBO Treatment Details Treatment Number: 9 Patient Type: Outpatient Chamber Type: Monoplace Chamber Serial #: [id number] Treatment Protocol: 2.0 ATA with 90 minutes oxygen, and no air breaks Treatment Details Compression Rate Down: 1.5 psi / minute De-Compression Rate Up: 1.5 psi / minute Air breaks and breathing Compress Tx Pressure periods Decompress Decompress Begins Reached (leave unused spaces Begins Ends blank) Chamber Pressure (ATA) 1 2 - - - - - - 2 1 Clock Time (24 hr) 10:15 10:26 - - - - - - 11:56 12:07 Treatment Length: 112 (minutes) Treatment Segments: 4 Capillary Blood Glucose Pre Capillary Blood Glucose (mg/dl): Post Capillary Blood Glucose (mg/dl): Vital Signs Capillary Blood Glucose Reference Range: 80 - 120 mg / dl HBO Diabetic Blood Glucose Intervention Range: <131 mg/dl or >249 mg/dl Time Vitals Blood Respiratory Capillary Blood Glucose Pulse Action Type: Pulse: Temperature: Taken: Pressure: Rate: Glucose (mg/dl): Meter #: Oximetry (%) Taken: Pre 09:32 132/60 78 16 98.1 Post 12:10 128/72 90 16 97.8 Treatment Response Treatment Completion Status: Treatment Completed without Adverse Event [name] [name] ([id number]) HBO Attestation I certify that I supervised this HBO treatment in accordance with Medicare guidelines. A  trained Yes emergency response team is readily available per hospital policies and procedures. Continue HBOT as ordered. Yes Electronic Signature(s) Signed: 09/06/2016 12:27:57 PM By: [name] FNP Previous Signature: 09/06/2016 12:20:51 PM Version By: Lorine Bears RCP, RRT, CHT Entered By: [name] on 09/06/2016 12:27:57 [name] [name] ([id number]) -------------------------------------------------------------------------------- HBO Safety Checklist Details Patient Name: [name] [name] Date of Service: 09/06/2016 10:00 AM Medical Record Number: [id number] Patient Account Number: [id number] Date of Birth/Sex: [birth year] (55 y.o. Male) Treating RN: Primary Care Physician: Nicky Pugh Other Clinician: Jacqulyn Bath Referring Physician: Nicky Pugh Treating Physician/Extender: Loistine Chance in Treatment: 4 HBO Safety Checklist Items Safety Checklist Consent Form Signed Patient voided / foley secured and emptied When did you last eato 08:00 am Last dose of injectable or oral agent n/a NA Ostomy pouch emptied and vented if applicable NA All implantable devices assessed, documented and approved NA Intravenous access site secured and place Valuables secured Linens and cotton and cotton/polyester blend (less than 51% polyester) Personal oil-based products / skin lotions / body lotions removed NA Wigs or hairpieces removed NA Smoking or tobacco materials removed Books / newspapers / magazines / loose paper removed Cologne, aftershave, perfume and deodorant removed Jewelry removed (may wrap wedding band) NA Make-up removed Hair care products removed Battery operated devices (external) removed Heating patches and chemical warmers removed NA Titanium eyewear removed NA Nail polish cured greater than 10 hours NA Casting material cured greater than 10 hours NA Hearing aids removed NA Loose dentures or partials removed NA Prosthetics have been  removed Patient demonstrates correct use of air break device (if applicable) Patient concerns have been addressed Patient grounding bracelet on and cord attached to chamber Specifics for Inpatients (complete in addition to above) Medication sheet sent with patient Intravenous medications needed or due during therapy sent with patient [name] [name] [name] [name] ([id number]) Drainage  tubes (e.g. nasogastric tube or chest tube secured and vented) Endotracheal or Tracheotomy tube secured Cuff deflated of air and inflated with saline Airway suctioned Electronic Signature(s) Signed: 09/06/2016 12:20:51 PM By: Lorine Bears RCP, RRT, CHT Entered By: Lorine Bears on 09/06/2016 09:42:00

## 2016-09-06 NOTE — Progress Notes (Signed)
JAREMY, HARTLINE ([id number]) Visit Report for 09/06/2016 Arrival Information Details Patient Name: [name], [name] Date of Service: 09/06/2016 10:00 AM Medical Record Number: [id number] Patient Account Number: [id number] Date of Birth/Sex: Dec 11, 1960 (55 y.o. Male) Treating RN: Primary Care Physician: Nicky Pugh Other Clinician: Jacqulyn Bath Referring Physician: Nicky Pugh Treating Physician/Extender: Loistine Chance in Treatment: 4 Visit Information History Since Last Visit Added or deleted any medications: No Patient Arrived: Ambulatory Any new allergies or adverse reactions: No Arrival Time: 09:32 Had a fall or experienced change in No Accompanied By: [name] activities of daily living that may affect Cab risk of falls: Transfer Assistance: None Signs or symptoms of abuse/neglect since last No Patient Identification Verified: Yes visito Secondary Verification Process Yes Hospitalized since last visit: No Completed: Pain Present Now: No Patient Requires Transmission- No Based Precautions: Patient Has Alerts: No Electronic Signature(s) Signed: 09/06/2016 12:20:51 PM By: Lorine Bears RCP, RRT, CHT Entered By: Lorine Bears on 09/06/2016 09:40:06 [place], [place] ([id number]) -------------------------------------------------------------------------------- Encounter Discharge Information Details Patient Name: [name] Date of Service: 09/06/2016 10:00 AM Medical Record Number: [id number] Patient Account Number: [id number] Date of Birth/Sex: 08/27/1961 (55 y.o. Male) Treating RN: Primary Care Physician: Nicky Pugh Other Clinician: Jacqulyn Bath Referring Physician: Nicky Pugh Treating Physician/Extender: Loistine Chance in Treatment: 4 Encounter Discharge Information Items Discharge Pain Level: 0 Discharge Condition: Stable Ambulatory Status: Ambulatory Nursing Discharge Destination: Home Transportation:  Other Catering manager Accompanied By: [name] Schedule Follow-up Appointment: No Medication Reconciliation completed No and provided to Patient/Care Damariz Paganelli: Clinical Summary of Care: Notes Patient has an HBO treatment scheduled on 09/07/16 at 10:00 am. Electronic Signature(s) Signed: 09/06/2016 12:20:51 PM By: Lorine Bears RCP, RRT, CHT Entered By: Lorine Bears on 09/06/2016 12:20:26 [name] ([id number]) -------------------------------------------------------------------------------- Vitals Details Patient Name: [name] Date of Service: 09/06/2016 10:00 AM Medical Record Number: [id number] Patient Account Number: [id number] Date of Birth/Sex: 10/12/1961 (55 y.o. Male) Treating RN: Primary Care Physician: Nicky Pugh Other Clinician: Jacqulyn Bath Referring Physician: Nicky Pugh Treating Physician/Extender: Loistine Chance in Treatment: 4 Vital Signs Time Taken: 09:32 Temperature (F): 98.1 Height (in): 68 Pulse (bpm): 78 Weight (lbs): 115 Respiratory Rate (breaths/min): 16 Body Mass Index (BMI): 17.5 Blood Pressure (mmHg): 132/60 Reference Range: 80 - 120 mg / dl Electronic Signature(s) Signed: 09/06/2016 12:20:51 PM By: Lorine Bears RCP, RRT, CHT Entered By: Lorine Bears on 09/06/2016 09:40:39

## 2016-09-07 ENCOUNTER — Encounter: Payer: Medicaid Other | Admitting: Physician Assistant

## 2016-09-07 DIAGNOSIS — C139 Malignant neoplasm of hypopharynx, unspecified: Secondary | ICD-10-CM | POA: Diagnosis not present

## 2016-09-07 NOTE — Progress Notes (Signed)
[name], [name] ([id number]) Visit Report for 09/07/2016 Arrival Information Details Patient Name: [name], [name] Date of Service: 09/07/2016 10:00 AM Medical Record Number: [id number] Patient Account Number: [id number] Date of Birth/Sex: 07/09/1961 (55 y.o. Male) Treating RN: Primary Care Physician: [name] Other Clinician: [name] Referring Physician: [name] Treating Physician/Extender: [name], [name] Weeks in Treatment: 5 Visit Information History Since Last Visit Added or deleted any medications: No Patient Arrived: Ambulatory Any new allergies or adverse reactions: No Arrival Time: 09:33 Had a fall or experienced change in No Accompanied By: [name] activities of daily living that may affect Cab risk of falls: Transfer Assistance: None Signs or symptoms of abuse/neglect since last No Patient Identification Verified: Yes visito Secondary Verification Process Yes Hospitalized since last visit: No Completed: Pain Present Now: No Patient Requires Transmission- No Based Precautions: Patient Has Alerts: No Electronic Signature(s) Signed: 09/07/2016 12:21:25 PM By: [name] Entered By: [name] on 09/07/2016 10:21:51 [name], [name] ([id number]) -------------------------------------------------------------------------------- Encounter Discharge Information Details Patient Name: [name] Date of Service: 09/07/2016 10:00 AM Medical Record Number: [id number] Patient Account Number: [id number] Date of Birth/Sex: Aug 23, 1961 (55 y.o. Male) Treating RN: Primary Care Physician: [name] Other Clinician: [name] Referring Physician: [name] Treating Physician/Extender: [name], [name] Weeks in Treatment: 5 Encounter Discharge Information Items Discharge Pain Level: 0 Discharge Condition: Stable Ambulatory Status: Ambulatory Nursing Discharge Destination: Home Transportation:  Other Catering manager Accompanied By: [name] Schedule Follow-up Appointment: No Medication Reconciliation completed No and provided to Patient/Care [name]: Clinical Summary of Care: Notes Patient will go to [facility] by way of [employer] [facility]. Patient has an HBO treatment scheduled on 09/08/16 at 10:00 am. Electronic Signature(s) Signed: 09/07/2016 12:21:25 PM By: [name] Entered By: [name] on 09/07/2016 12:20:13 [name], [name] ([id number]) -------------------------------------------------------------------------------- Vitals Details Patient Name: [name] Date of Service: 09/07/2016 10:00 AM Medical Record Number: [id number] Patient Account Number: [id number] Date of Birth/Sex: [birth year] (55 y.o. Male) Treating RN: Primary Care Physician: [name] Other Clinician: [name] Referring Physician: [name] Treating Physician/Extender: [name], [name] Weeks in Treatment: 5 Vital Signs Time Taken: 09:35 Temperature (F): 97.8 Height (in): 68 Pulse (bpm): 72 Weight (lbs): 115 Respiratory Rate (breaths/min): 16 Body Mass Index (BMI): 17.5 Blood Pressure (mmHg): 108/76 Reference Range: 80 - 120 mg / dl Electronic Signature(s) Signed: 09/07/2016 12:21:25 PM By: [name] Entered By: [name], [name] on 09/07/2016 10:22:28

## 2016-09-08 ENCOUNTER — Encounter: Payer: Medicaid Other | Admitting: Physician Assistant

## 2016-09-08 DIAGNOSIS — C139 Malignant neoplasm of hypopharynx, unspecified: Secondary | ICD-10-CM | POA: Diagnosis not present

## 2016-09-08 NOTE — Progress Notes (Signed)
[name], [name] ([id number]) Visit Report for 09/08/2016 Arrival Information Details Patient Name: [name], [name] Date of Service: 09/08/2016 10:00 AM Medical Record Number: [id number] Patient Account Number: [id number] Date of Birth/Sex: Sep 02, 1961 (55 y.o. Male) Treating RN: Primary Care Physician: Nicky Pugh Other Clinician: Jacqulyn Bath Referring Physician: Nicky Pugh Treating Physician/Extender: Melburn Hake, HOYT Weeks in Treatment: 5 Visit Information History Since Last Visit Added or deleted any medications: No Patient Arrived: Ambulatory Any new allergies or adverse reactions: No Arrival Time: 09:35 Had a fall or experienced change in No Accompanied By: [name] activities of daily living that may affect Cab risk of falls: Transfer Assistance: None Signs or symptoms of abuse/neglect since last No Patient Identification Verified: Yes visito Secondary Verification Process Yes Hospitalized since last visit: No Completed: Pain Present Now: No Patient Requires Transmission- No Based Precautions: Patient Has Alerts: No Electronic Signature(s) Signed: 09/08/2016 2:31:42 PM By: Lorine Bears RCP, RRT, CHT Entered By: Lorine Bears on 09/08/2016 09:41:06 [place], [place] ([id number]) -------------------------------------------------------------------------------- Encounter Discharge Information Details Patient Name: [name] Date of Service: 09/08/2016 10:00 AM Medical Record Number: [id number] Patient Account Number: [id number] Date of Birth/Sex: Jul 06, 1961 (55 y.o. Male) Treating RN: Primary Care Physician: Nicky Pugh Other Clinician: Jacqulyn Bath Referring Physician: Nicky Pugh Treating Physician/Extender: Melburn Hake, HOYT Weeks in Treatment: 5 Encounter Discharge Information Items Discharge Pain Level: 0 Discharge Condition: Stable Ambulatory Status: Ambulatory Nursing Discharge Destination: Home Transportation:  Other Accompanied ByDorthula Perfect Schedule Follow-up Appointment: No Medication Reconciliation completed No and provided to Patient/Care Zavannah Deblois: Clinical Summary of Care: Notes Patient will go back to [facility] by way of [facility] which was called at  12:15 pm. Patient has an HBO treatment scheduled on 09/09/16 at 10:00 a.m. Electronic Signature(s) Signed: 09/08/2016 2:31:42 PM By: Lorine Bears RCP, RRT, CHT Entered By: Lorine Bears on 09/08/2016 14:29:12 [place], [place] ([id number]) -------------------------------------------------------------------------------- Vitals Details Patient Name: [name] Date of Service: 09/08/2016 10:00 AM Medical Record Number: [id number] Patient Account Number: [id number] Date of Birth/Sex: Apr 22, 1961 (55 y.o. Male) Treating RN: Primary Care Physician: Nicky Pugh Other Clinician: Jacqulyn Bath Referring Physician: Nicky Pugh Treating Physician/Extender: Melburn Hake, HOYT Weeks in Treatment: 5 Vital Signs Time Taken: 09:35 Temperature (F): 98.0 Height (in): 68 Pulse (bpm): 90 Weight (lbs): 115 Respiratory Rate (breaths/min): 16 Body Mass Index (BMI): 17.5 Blood Pressure (mmHg): 112/76 Reference Range: 80 - 120 mg / dl Electronic Signature(s) Signed: 09/08/2016 2:31:42 PM By: Lorine Bears RCP, RRT, CHT Entered By: Becky Sax, Amado Nash on 09/08/2016 09:41:27

## 2016-09-09 ENCOUNTER — Encounter (HOSPITAL_BASED_OUTPATIENT_CLINIC_OR_DEPARTMENT_OTHER): Payer: Medicaid Other | Admitting: General Surgery

## 2016-09-09 DIAGNOSIS — Y842 Radiological procedure and radiotherapy as the cause of abnormal reaction of the patient, or of later complication, without mention of misadventure at the time of the procedure: Secondary | ICD-10-CM | POA: Diagnosis not present

## 2016-09-09 DIAGNOSIS — C139 Malignant neoplasm of hypopharynx, unspecified: Secondary | ICD-10-CM | POA: Diagnosis not present

## 2016-09-09 DIAGNOSIS — L598 Other specified disorders of the skin and subcutaneous tissue related to radiation: Secondary | ICD-10-CM | POA: Diagnosis not present

## 2016-09-09 NOTE — Progress Notes (Signed)
Tolerated HBO well 

## 2016-09-09 NOTE — Progress Notes (Signed)
[name] ([id number]) Visit Report for 09/08/2016 HBO Details Patient Name: [name] Date of Service: 09/08/2016 10:00 AM Medical Record Number: [id number] Patient Account Number: [id number] Date of Birth/Sex: 1961/05/06 (55 y.o. 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 HBO Treatment Details Treatment Number: 11 Patient Type: Outpatient Chamber Type: Monoplace Chamber Serial #: [id number] Treatment Protocol: 2.0 ATA with 90 minutes oxygen, and no air breaks Treatment Details Compression Rate Down: 1.5 psi / minute De-Compression Rate Up: 1.5 psi / minute Air breaks and breathing Compress Tx Pressure periods Decompress Decompress Begins Reached (leave unused spaces Begins Ends blank) Chamber Pressure (ATA) 1 2 - - - - - - 2 1 Clock Time (24 hr) 10:05 10:17 - - - - - - 11:47 11:59 Treatment Length: 114 (minutes) Treatment Segments: 4 Capillary Blood Glucose Pre Capillary Blood Glucose (mg/dl): Post Capillary Blood Glucose (mg/dl): Vital Signs Capillary Blood Glucose Reference Range: 80 - 120 mg / dl HBO Diabetic Blood Glucose Intervention Range: <131 mg/dl or >249 mg/dl Time Vitals Blood Respiratory Capillary Blood Glucose Pulse Action Type: Pulse: Temperature: Taken: Pressure: Rate: Glucose (mg/dl): Meter #: Oximetry (%) Taken: Pre 09:35 112/76 90 16 98 Post 12:02 122/86 90 16 97.6 Treatment Response Treatment Completion Status: Treatment Completed without Adverse Event [name], [name] ([id number]) Electronic Signature(s) Signed: 09/08/2016 2:31:42 PM By: Lorine Bears RCP, RRT, CHT Signed:  09/09/2016 1:37:32 AM By: Worthy Keeler PA-C Entered By: Lorine Bears on 09/08/2016 14:28:42 [name], [name] ([id number]) -------------------------------------------------------------------------------- HBO Safety Checklist Details Patient Name: [name] Date of Service: 09/08/2016 10:00 AM Medical Record Number: [id number] Patient Account Number: [id number] Date of Birth/Sex: 02/12/1961 (55 y.o. Male) Treating RN: Primary Care Physician: Nicky Pugh Other Clinician: Jacqulyn Bath Referring Physician: Nicky Pugh Treating Physician/Extender: Melburn Hake, HOYT Weeks in Treatment: 5 HBO Safety Checklist Items Safety Checklist Consent Form Signed Patient voided / foley secured and emptied When did you last eato 07:00 am Last dose of injectable or oral agent n/a NA Ostomy pouch emptied and vented if applicable NA All implantable devices assessed, documented and approved NA Intravenous access site secured and place Valuables secured Linens and cotton and cotton/polyester blend (less than 51% polyester) Personal oil-based products / skin lotions / body lotions removed NA Wigs or hairpieces removed NA Smoking or tobacco materials removed Books / newspapers / magazines / loose paper removed Cologne, aftershave, perfume and deodorant removed Jewelry removed (may wrap wedding band) NA Make-up removed Hair care products removed Battery operated devices (external) removed Heating patches and chemical warmers removed NA Titanium eyewear removed NA Nail polish cured greater than 10 hours NA Casting material cured greater than 10 hours NA Hearing aids removed NA Loose dentures or partials removed NA Prosthetics have been removed Patient demonstrates correct use of air break device (if applicable) Patient concerns have been addressed Patient grounding bracelet on and cord attached to chamber Specifics for Inpatients (complete in addition to above) NA Medication  sheet sent with patient Intravenous medications needed or due during therapy sent with patient [name], [name] ([id number]) NA Drainage tubes (e.g. nasogastric tube or chest tube secured and vented) tracheotomy collar completely Endotracheal or Tracheotomy tube secured wrapped with 100% cotton kerlix gauze to cover the velcro NA Cuff  deflated of air and inflated with saline NA Airway suctioned Electronic Signature(s) Signed: 09/08/2016 2:31:42 PM By: Lorine Bears RCP, RRT, CHT Entered By: Becky Sax, Amado Nash on 09/08/2016 09:43:04

## 2016-09-09 NOTE — Progress Notes (Signed)
Ryan, [name] ([id number]) Visit Report for 09/07/2016 HBO Details Patient Name: [name], [name] Date of Service: 09/07/2016 10:00 AM Medical Record Number: [id number] Patient Account Number: [id number] Date of Birth/Sex: 02/06/1961 (55 y.o. 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 HBO Treatment Details Treatment Number: 10 Patient Type: Outpatient Chamber Type: Monoplace Chamber Serial #: [id number] Treatment Protocol: 2.0 ATA with 90 minutes oxygen, and no air breaks Treatment Details Compression Rate Down: 1.5 psi / minute De-Compression Rate Up: 1.5 psi / minute Air breaks and breathing Compress Tx Pressure periods Decompress Decompress Begins Reached (leave unused spaces Begins Ends blank) Chamber Pressure (ATA) 1 2 - - - - - - 2 1 Clock Time (24 hr) 10:11 10:23 - - - - - - 11:53 12:03 Treatment Length: 112 (minutes) Treatment Segments: 4 Capillary Blood Glucose Pre Capillary Blood Glucose (mg/dl): Post Capillary Blood Glucose (mg/dl): Vital Signs Capillary Blood Glucose Reference Range: 80 - 120 mg / dl HBO Diabetic Blood Glucose Intervention Range: <131 mg/dl or >249 mg/dl Time Vitals Blood Respiratory Capillary Blood Glucose Pulse Action Type: Pulse: Temperature: Taken: Pressure: Rate: Glucose (mg/dl): Meter #: Oximetry (%) Taken: Pre 09:35 108/76 72 16 97.8 Post 12:05 120/80 78 16 97.8 Treatment Response Treatment Completion Status: Treatment Completed without Adverse Event Pierce, Jun ([id number]) Electronic Signature(s) Signed: 09/07/2016 12:21:25 PM By: Lorine Bears RCP, RRT,  CHT Signed: 09/09/2016 1:37:32 AM By: Worthy Keeler PA-C Entered By: Lorine Bears on 09/07/2016 12:17:57 [place], [place] ([id number]) -------------------------------------------------------------------------------- HBO Safety Checklist Details Patient Name: [name] Date of Service: 09/07/2016 10:00 AM Medical Record Number: [id number] Patient Account Number: [id number] Date of Birth/Sex: Dec 24, 1960 (55 y.o. Male) Treating RN: Primary Care Physician: Nicky Pugh Other Clinician: Jacqulyn Bath Referring Physician: Nicky Pugh Treating Physician/Extender: Melburn Hake, HOYT Weeks in Treatment: 5 HBO Safety Checklist Items Safety Checklist Consent Form Signed Patient voided / foley secured and emptied When did you last eato 07:00 am (tube feeding) Last dose of injectable or oral agent n/a NA Ostomy pouch emptied and vented if applicable NA All implantable devices assessed, documented and approved NA Intravenous access site secured and place Valuables secured Linens and cotton and cotton/polyester blend (less than 51% polyester) Personal oil-based products / skin lotions / body lotions removed NA Wigs or hairpieces removed NA Smoking or tobacco materials removed Books / newspapers / magazines / loose paper removed Cologne, aftershave, perfume and deodorant removed Jewelry removed (may wrap wedding band) NA Make-up removed Hair care products removed Battery operated devices (external) removed Heating patches and chemical warmers removed NA Titanium eyewear removed NA Nail polish cured greater than 10 hours NA Casting material cured greater than 10 hours NA Hearing aids removed NA Loose dentures or partials removed NA Prosthetics have been removed Patient demonstrates correct use of air break device (if applicable) Patient concerns have been addressed Patient grounding bracelet on and cord attached to chamber Specifics for Inpatients (complete in addition  to above) Medication sheet sent with patient Intravenous medications needed or due during therapy sent with patient RENAE, KORBER ([id number]) Drainage tubes (e.g. nasogastric tube or chest tube secured and vented) Endotracheal or Tracheotomy tube secured Cuff deflated of air and inflated with saline Airway suctioned Electronic Signature(s) Signed: 09/07/2016 12:21:25  PM By: Lorine Bears RCP, RRT, CHT Entered By: Lorine Bears on 09/07/2016 10:24:20

## 2016-09-10 ENCOUNTER — Encounter: Payer: MEDICAID | Admitting: Nurse Practitioner

## 2016-09-10 NOTE — Progress Notes (Signed)
[name], [name] ([id number]) Visit Report for 09/09/2016 HBO Details Patient Name: [name], [name] Date of Service: 09/09/2016 10:00 AM Medical Record Number: [id number] Patient Account Number: [id number] Date of Birth/Sex: [birth year] (55 y.o. 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 HBO Treatment Details Treatment Number: 12 Patient Type: Outpatient Chamber Type: Monoplace Chamber Serial #: [id number] Treatment Protocol: 2.0 ATA with 90 minutes oxygen, and no air breaks Treatment Details Compression Rate Down: 1.5 psi / minute De-Compression Rate Up: 1.5 psi / minute Air breaks and breathing Compress Tx Pressure periods Decompress Decompress Begins Reached (leave unused spaces Begins Ends blank) Chamber Pressure (ATA) 1 2 - - - - - - 2 1 Clock Time (24 hr) 10:47 10:59 - - - - - - 12:29 12:39 Treatment Length: 112 (minutes) Treatment Segments: 4 Capillary Blood Glucose Pre Capillary Blood Glucose (mg/dl): Post Capillary Blood Glucose (mg/dl): Vital Signs Capillary Blood Glucose Reference Range: 80 - 120 mg / dl HBO Diabetic Blood Glucose Intervention Range: <131 mg/dl or >249 mg/dl Time Vitals Blood Respiratory Capillary Blood Glucose Pulse Action Type: Pulse: Temperature: Taken: Pressure: Rate: Glucose (mg/dl): Meter #: Oximetry (%) Taken: Pre 10:40 112/76 84 16 97.9 Post 12:45 132/78 78 16 97.8 Treatment Response Treatment Completion Status: Treatment Completed without Adverse Event [place], Frances ([id number]) Electronic Signature(s) Signed: 09/09/2016 3:19:15 PM By: Lorine Bears RCP, RRT, CHT Signed:  09/09/2016 3:59:06 PM By: Judene Companion MD Entered By: Lorine Bears on 09/09/2016 13:02:55 [place], [place] ([id number]) -------------------------------------------------------------------------------- HBO Safety Checklist Details Patient Name: [name] Date of Service: 09/09/2016 10:00 AM Medical Record Number: [id number] Patient Account Number: [id number] Date of Birth/Sex: 1960-12-16 (55 y.o. Male) Treating RN: Primary Care Physician: Nicky Pugh Other Clinician: Jacqulyn Bath Referring Physician: Nicky Pugh Treating Physician/Extender: Benjaman Pott in Treatment: 5 HBO Safety Checklist Items Safety Checklist Consent Form Signed Patient voided / foley secured and emptied When did you last eato 07:00 am (tube feeding) Last dose of injectable or oral agent n/a NA Ostomy pouch emptied and vented if applicable NA All implantable devices assessed, documented and approved NA Intravenous access site secured and place Valuables secured Linens and cotton and cotton/polyester blend (less than 51% polyester) Personal oil-based products / skin lotions / body lotions removed NA Wigs or hairpieces removed NA Smoking or tobacco materials removed Books / newspapers / magazines / loose paper removed Cologne, aftershave, perfume and deodorant removed Jewelry removed (may wrap wedding band) NA Make-up removed Hair care products removed Battery operated devices (external) removed Heating patches and chemical warmers removed NA Titanium eyewear removed NA Nail polish cured greater than 10 hours NA Casting material cured greater than 10 hours NA Hearing aids removed NA Loose dentures or partials removed NA Prosthetics have been removed Patient demonstrates correct use of air break device (if applicable) Patient concerns have been addressed Patient grounding bracelet on and cord attached to chamber Specifics for Inpatients (complete in addition to  above) Medication sheet sent with patient Intravenous medications needed or due during therapy sent with patient [name], [name] ([id number]) Drainage tubes (e.g. nasogastric tube or chest tube secured and vented) Endotracheal or Tracheotomy tube secured Cuff deflated of air and inflated with saline Airway suctioned Electronic Signature(s) Signed: 09/09/2016 3:19:15 PM By: Juleen China,  RCP,RRT,CHT, Sallie RCP, RRT, CHT Entered By: Lorine Bears on 09/09/2016 11:18:19

## 2016-09-10 NOTE — Progress Notes (Signed)
CHARITY, SCHILL ([id number]) Visit Report for 09/01/2016 HBO Details Patient Name: [name], [name] Date of Service: 09/01/2016 10:00 AM Medical Record Patient Account Number: [id number] Number: Treating RN: [birth year] (55 y.o. 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 HBO Treatment Details Treatment Number: 6 Patient Type: Outpatient Chamber Type: Monoplace Chamber Serial #: [id number] Treatment Protocol: 2.0 ATA with 90 minutes oxygen, and no air breaks Treatment Details Compression Rate Down: 1.5 psi / minute De-Compression Rate Up: 1.5 psi / minute Air breaks and breathing Compress Tx Pressure periods Decompress Decompress Begins Reached (leave unused spaces Begins Ends blank) Chamber Pressure (ATA) 1 2 - - - - - - 2 1 Clock Time (24 hr) 10:39 10:49 - - - - - - 12:19 12:29 Treatment Length: 110 (minutes) Treatment Segments: 4 Capillary Blood Glucose Pre Capillary Blood Glucose (mg/dl): Post Capillary Blood Glucose (mg/dl): Vital Signs Capillary Blood Glucose Reference Range: 80 - 120 mg / dl HBO Diabetic Blood Glucose Intervention Range: <131 mg/dl or >249 mg/dl Time Vitals Blood Respiratory Capillary Blood Glucose Pulse Action Type: Pulse: Temperature: Taken: Pressure: Rate: Glucose (mg/dl): Meter #: Oximetry (%) Taken: Pre 10:25 110/78 84 16 98.4 Post 12:32 130/82 72 16 97.8 Voeltz, [place] ([id number]) Treatment Response Treatment Completion Status: Treatment Completed without Adverse Event Physician Notes No concerns with treatment given HBO Attestation I certify that I supervised this HBO  treatment in accordance with Medicare guidelines. A trained Yes emergency response team is readily available per hospital policies and procedures. Continue HBOT as ordered. Yes Electronic Signature(s) Signed: 09/10/2016 8:10:29 AM By: Linton Ham MD Previous Signature: 09/01/2016 3:14:04 PM Version By: Lorine Bears RCP, RRT, CHT Entered By: Linton Ham on 09/01/2016 17:52:03 [name], [name] ([id number]) -------------------------------------------------------------------------------- HBO Safety Checklist Details Patient Name: [name] Date of Service: 09/01/2016 10:00 AM Medical Record Patient Account Number: [id number] Number: Treating RN: 10-Jul-1961 (55 y.o. Other Clinician: Jacqulyn Bath Date of Birth/Sex: Male) Treating ROBSON, MICHAEL Primary Care Physician/Extender: Orland Dec, ERNEST Physician: Referring Physician: Nicky Pugh Weeks in Treatment: 4 HBO Safety Checklist Items Safety Checklist Consent Form Signed Patient voided / foley secured and emptied When did you last eato 07:00 am (tube feeding) Last dose of injectable or oral agent n/a NA Ostomy pouch emptied and vented if applicable NA All implantable devices assessed, documented and approved NA Intravenous access site secured and place Valuables secured Linens and cotton and cotton/polyester blend (less than 51% polyester) Personal oil-based products / skin lotions / body lotions removed NA Wigs or hairpieces removed NA Smoking or tobacco materials removed Books / newspapers / magazines / loose paper removed Cologne, aftershave, perfume and deodorant removed Jewelry removed (may wrap wedding band) NA Make-up removed Hair care products removed Battery operated devices (external) removed Heating patches and chemical warmers removed NA Titanium eyewear removed NA Nail polish cured greater than 10 hours NA Casting material cured greater than 10 hours NA Hearing aids  removed NA Loose dentures or partials removed NA Prosthetics have been removed Patient demonstrates correct use of air break device (if applicable) Patient concerns have been addressed Patient grounding bracelet on and cord attached to chamber Specifics for Inpatients (complete in addition to above) Medication sheet sent with patient [name], [name] ([id number])  NA NA Intravenous medications needed or due during therapy sent with patient NA Drainage tubes (e.g. nasogastric tube or chest tube secured and vented) collar completely wrapped with Endotracheal or Tracheotomy tube secured kerlix gauze to cover velcro NA Cuff deflated of air and inflated with saline NA Airway suctioned Electronic Signature(s) Signed: 09/01/2016 3:14:04 PM By: Lorine Bears RCP, RRT, CHT Entered By: Lorine Bears on 09/01/2016 11:03:57

## 2016-09-10 NOTE — Progress Notes (Signed)
MARINA, MERRYWEATHER ([id number]) Visit Report for 09/09/2016 Arrival Information Details Patient Name: MARCIANO, MEGA Date of Service: 09/09/2016 10:00 AM Medical Record Number: [id number] Patient Account Number: [id number] Date of Birth/Sex: October 01, 1961 (55 y.o. Male) Treating RN: Primary Care Physician: Nicky Pugh Other Clinician: Jacqulyn Bath Referring Physician: Nicky Pugh Treating Physician/Extender: Benjaman Pott in Treatment: 5 Visit Information History Since Last Visit Added or deleted any medications: No Patient Arrived: Ambulatory Any new allergies or adverse reactions: No Arrival Time: 10:20 Had a fall or experienced change in No Accompanied By: [name] activities of daily living that may affect Cab risk of falls: Transfer Assistance: None Signs or symptoms of abuse/neglect since last No Patient Identification Verified: Yes visito Secondary Verification Process Yes Hospitalized since last visit: No Completed: Pain Present Now: No Patient Requires Transmission- No Based Precautions: Patient Has Alerts: No Electronic Signature(s) Signed: 09/09/2016 3:19:15 PM By: Lorine Bears RCP, RRT, CHT Entered By: Lorine Bears on 09/09/2016 11:16:48 [place], [place] ([id number]) -------------------------------------------------------------------------------- Encounter Discharge Information Details Patient Name: [name] Date of Service: 09/09/2016 10:00 AM Medical Record Number: [id number] Patient Account Number: [id number] Date of Birth/Sex: Jun 24, 1961 (55 y.o. Male) Treating RN: Primary Care Physician: Nicky Pugh Other Clinician: Jacqulyn Bath Referring Physician: Nicky Pugh Treating Physician/Extender: Benjaman Pott in Treatment: 5 Encounter Discharge Information Items Discharge Pain Level: 0 Discharge Condition: Stable Ambulatory Status: Ambulatory Nursing Discharge Destination: Home Transportation: Other Gaffer Accompanied By: [name] Schedule Follow-up Appointment: No Medication Reconciliation completed No and provided to Patient/Care Thuy Atilano: Clinical Summary of Care: Notes Patient has an HBO treatment scheduled on 09/13/16 at 10:00 am. Electronic Signature(s) Signed: 09/09/2016 3:19:15 PM By: Lorine Bears RCP, RRT, CHT Entered By: Lorine Bears on 09/09/2016 13:04:10 [place], [place] ([id number]) -------------------------------------------------------------------------------- Vitals Details Patient Name: [name] Date of Service: 09/09/2016 10:00 AM Medical Record Number: [id number] Patient Account Number: [id number] Date of Birth/Sex: 1961-08-13 (55 y.o. Male) Treating RN: Primary Care Physician: Nicky Pugh Other Clinician: Jacqulyn Bath Referring Physician: Nicky Pugh Treating Physician/Extender: Benjaman Pott in Treatment: 5 Vital Signs Time Taken: 10:40 Temperature (F): 97.9 Height (in): 68 Pulse (bpm): 84 Weight (lbs): 115 Respiratory Rate (breaths/min): 16 Body Mass Index (BMI): 17.5 Blood Pressure (mmHg): 112/76 Reference Range: 80 - 120 mg / dl Electronic Signature(s) Signed: 09/09/2016 3:19:15 PM By: Lorine Bears RCP, RRT, CHT Entered By: Lorine Bears on 09/09/2016 11:17:26

## 2016-09-13 ENCOUNTER — Encounter: Payer: Medicaid Other | Admitting: Nurse Practitioner

## 2016-09-13 DIAGNOSIS — C139 Malignant neoplasm of hypopharynx, unspecified: Secondary | ICD-10-CM | POA: Diagnosis not present

## 2016-09-13 NOTE — Progress Notes (Signed)
Ryan, [name] ([id number]) Visit Report for 09/13/2016 Arrival Information Details Patient Name: [name], [name] Date of Service: 09/13/2016 10:00 AM Medical Record Number: [id number] Patient Account Number: [id number] Date of Birth/Sex: [birth year] (55 y.o. Male) Treating RN: Primary Care Physician: Nicky Pugh Other Clinician: Jacqulyn Bath Referring Physician: Nicky Pugh Treating Physician/Extender: Loistine Chance in Treatment: 5 Visit Information History Since Last Visit Added or deleted any medications: No Patient Arrived: Ambulatory Any new allergies or adverse reactions: No Arrival Time: 09:27 Had a fall or experienced change in No Accompanied By: [name] activities of daily living that may affect Cab risk of falls: Transfer Assistance: None Signs or symptoms of abuse/neglect since last No Patient Identification Verified: Yes visito Secondary Verification Process Yes Pain Present Now: No Completed: Patient Requires Transmission- No Based Precautions: Patient Has Alerts: No Electronic Signature(s) Signed: 09/13/2016 12:11:03 PM By: Lorine Bears RCP, RRT, CHT Entered By: Lorine Bears on 09/13/2016 09:33:59 [place], [place] ([id number]) -------------------------------------------------------------------------------- Encounter Discharge Information Details Patient Name: [name] Date of Service: 09/13/2016 10:00 AM Medical Record Number: [id number] Patient Account Number: [id number] Date of Birth/Sex: 06/13/1961 (55 y.o. Male) Treating RN: Primary Care Physician: Nicky Pugh Other Clinician: Jacqulyn Bath Referring Physician: Nicky Pugh Treating Physician/Extender: Loistine Chance in Treatment: 5 Encounter Discharge Information Items Discharge Pain Level: 0 Discharge Condition: Stable Ambulatory Status: Ambulatory Nursing Discharge Destination: Home Transportation: Other Catering manager Accompanied  By: [name] Schedule Follow-up Appointment: No Medication Reconciliation completed No and provided to Patient/Care [name]: Clinical Summary of Care: Notes Patient has an HBO treatment scheduled on 09/14/16 at 10:00 am. Electronic Signature(s) Signed: 09/13/2016 12:11:03 PM By: Lorine Bears RCP, RRT, CHT Entered By: Lorine Bears on 09/13/2016 12:10:40 [place], [place] ([id number]) -------------------------------------------------------------------------------- Vitals Details Patient Name: [name] Date of Service: 09/13/2016 10:00 AM Medical Record Number: [id number] Patient Account Number: [id number] Date of Birth/Sex: 1961/04/01 (54 y.o. Male) Treating RN: Primary Care Physician: Nicky Pugh Other Clinician: Jacqulyn Bath Referring Physician: Nicky Pugh Treating Physician/Extender: Loistine Chance in Treatment: 5 Vital Signs Time Taken: 09:27 Temperature (F): 98.1 Height (in): 68 Pulse (bpm): 90 Weight (lbs): 115 Respiratory Rate (breaths/min): 16 Body Mass Index (BMI): 17.5 Blood Pressure (mmHg): 116/68 Reference Range: 80 - 120 mg / dl Electronic Signature(s) Signed: 09/13/2016 12:11:03 PM By: Lorine Bears RCP, RRT, CHT Entered By: Lorine Bears on 09/13/2016 09:34:19

## 2016-09-13 NOTE — Progress Notes (Signed)
[name], [name] ([id number]) Visit Report for 09/13/2016 HBO Details Patient Name: [name], [name] Date of Service: 09/13/2016 10:00 AM Medical Record Number: [id number] Patient Account Number: [id number] Date of Birth/Sex: Jan 16, 1961 (55 y.o. 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 HBO Treatment Details Treatment Number: 13 Patient Type: Outpatient Chamber Type: Monoplace Chamber Serial #: [id number] Treatment Protocol: 2.0 ATA with 90 minutes oxygen, and no air breaks Treatment Details Compression Rate Down: 1.5 psi / minute De-Compression Rate Up: 1.5 psi / minute Air breaks and breathing Compress Tx Pressure periods Decompress Decompress Begins Reached (leave unused spaces Begins Ends blank) Chamber Pressure (ATA) 1 2 - - - - - - 2 1 Clock Time (24 hr) 10:06 10:18 - - - - - - 11:48 11:59 Treatment Length: 113 (minutes) Treatment Segments: 4 Capillary Blood Glucose Pre Capillary Blood Glucose (mg/dl): Post Capillary Blood Glucose (mg/dl): Vital Signs Capillary Blood Glucose Reference Range: 80 - 120 mg / dl HBO Diabetic Blood Glucose Intervention Range: <131 mg/dl or >249 mg/dl Time Vitals Blood Respiratory Capillary Blood Glucose Pulse Action Type: Pulse: Temperature: Taken: Pressure: Rate: Glucose (mg/dl): Meter #: Oximetry (%) Taken: Pre 09:27 116/68 90 16 98.1 Post 12:00 124/92 84 16 98.1 Treatment Response Treatment Completion Status: Treatment Completed without Adverse Event [name], [name] HBO Attestation I certify that I supervised this HBO treatment in accordance with Medicare guidelines. A  trained Yes emergency response team is readily available per hospital policies and procedures. Continue HBOT as ordered. Yes Electronic Signature(s) Signed: 09/13/2016 12:46:39 PM By: [name] Previous Signature: 09/13/2016 12:11:03 PM Version By: [name], [name], [name] Entered By: [name] on 09/13/2016 12:46:39 [place], [name] ([id number]) -------------------------------------------------------------------------------- HBO Safety Checklist Details Patient Name: [name] Date of Service: 09/13/2016 10:00 AM Medical Record Number: [id number] Patient Account Number: [id number] Date of Birth/Sex: [birth year] (55 y.o. Male) Treating RN: Primary Care Physician: [name] Other Clinician: [name] Referring Physician: [name] Treating Physician/Extender: [name] in Treatment: 5 HBO Safety Checklist Items Safety Checklist Consent Form Signed Patient voided / foley secured and emptied When did you last eato 07:00 am Last dose of injectable or oral agent n/a NA Ostomy pouch emptied and vented if applicable NA All implantable devices assessed, documented and approved NA Intravenous access site secured and place Valuables secured Linens and cotton and cotton/polyester blend (less than 51% polyester) Personal oil-based products / skin lotions / body lotions removed NA Wigs or hairpieces removed NA Smoking or tobacco materials removed Books / newspapers / magazines / loose paper removed Cologne, aftershave, perfume and deodorant removed Jewelry removed (may wrap wedding band) NA Make-up removed Hair care products removed Battery operated devices (external) removed Heating patches and chemical warmers removed NA Titanium eyewear removed NA Nail polish cured greater than 10 hours NA Casting material cured greater than 10 hours NA Hearing aids removed NA Loose dentures or partials removed NA Prosthetics have been  removed Patient demonstrates correct use of air break device (if applicable) Patient concerns have been addressed Patient grounding bracelet on and cord attached to chamber Specifics for Inpatients (complete in addition to above) NA Medication sheet sent with patient NA Intravenous medications needed or due during therapy sent with patient [name], [name] (  [id number]) NA Drainage tubes (e.g. nasogastric tube or chest tube secured and vented) trach collar wrapped in kerlix Endotracheal or Tracheotomy tube secured gauze to completely cover velcro NA Cuff deflated of air and inflated with saline NA Airway suctioned Electronic Signature(s) Signed: 09/13/2016 12:11:03 PM By: [name], [name], [name] Entered By: [name] on 09/13/2016 09:35:46

## 2016-09-14 ENCOUNTER — Encounter: Payer: Medicaid Other | Admitting: Physician Assistant

## 2016-09-14 DIAGNOSIS — C139 Malignant neoplasm of hypopharynx, unspecified: Secondary | ICD-10-CM | POA: Diagnosis not present

## 2016-09-14 NOTE — Progress Notes (Signed)
[name] ([id number]) Visit Report for 09/14/2016 Arrival Information Details Patient Name: [name], [name] 09/14/2016 10:00 Date of Service: AM Medical Record [id number] Number: Patient Account Number: [id number] 1961/03/15 (55 y.o. Treating RN: Afful, RN, BSN, Velva Harman Date of Birth/Sex: Male) Other Clinician: Jacqulyn Bath Primary Care Physician: Nicky Pugh Treating STONE III, HOYT Referring Physician: Nicky Pugh Physician/Extender: Weeks in Treatment: 6 Visit Information History Since Last Visit All ordered tests and consults were completed: No Patient Arrived: Ambulatory Added or deleted any medications: No Arrival Time: 10:15 Any new allergies or adverse reactions: No Accompanied By: self Had a fall or experienced change in No Transfer Assistance: None activities of daily living that may affect Patient Identification Verified: Yes risk of falls: Secondary Verification Process Yes Signs or symptoms of abuse/neglect since last No Completed: visito Patient Requires Transmission-Based No Hospitalized since last visit: No Precautions: Has Dressing in Place as Prescribed: Yes Patient Has Alerts: No Pain Present Now: No Electronic Signature(s) Signed: 09/14/2016 4:05:26 PM By: Regan Lemming BSN, RN Entered By: Regan Lemming on 09/14/2016 10:32:57 [name] ([id number]) -------------------------------------------------------------------------------- Patient/Caregiver Education Details Patient Name: [name], [name] 09/14/2016 10:00 Date of Service: AM Medical Record [id number] Number: Patient Account Number: [id number] 03/28/1961 (55 y.o. Treating RN: Baruch Gouty, RN, BSN, Velva Harman Date of Birth/Gender: Male) Other Clinician: Jacqulyn Bath Primary Care Physician: Nicky Pugh Treating STONE III, HOYT Referring Physician: Nicky Pugh Physician/Extender: Suella [place] in Treatment: 6 Education Assessment Education Provided To: Patient Education Topics Provided Welcome To The  [place]: Methods: Explain/Verbal Responses: State content correctly Electronic Signature(s) Signed: 09/14/2016 4:05:26 PM By: Regan Lemming BSN, RN Entered By: Regan Lemming on 09/14/2016 13:10:29 [name] ([id number]) -------------------------------------------------------------------------------- Vitals Details Patient Name: [name], [name] 09/14/2016 10:00 Date of Service: AM Medical Record [id number] Number: Patient Account Number: [id number] 1961/11/23 (55 y.o. Treating RN: Baruch Gouty, RN, BSN, Velva Harman Date of Birth/Sex: Male) Other Clinician: Jacqulyn Bath Primary Care Physician: Nicky Pugh Treating STONE III, HOYT Referring Physician: Nicky Pugh Physician/Extender: Weeks in Treatment: 6 Vital Signs Time Taken: 10:15 Temperature (F): 97.8 Height (in): 68 Pulse (bpm): 72 Weight (lbs): 115 Respiratory Rate (breaths/min): 16 Body Mass Index (BMI): 17.5 Blood Pressure (mmHg): 110/76 Reference Range: 80 - 120 mg / dl Electronic Signature(s) Signed: 09/14/2016 4:05:26 PM By: Regan Lemming BSN, RN Entered By: Regan Lemming on 09/14/2016 10:36:22

## 2016-09-15 ENCOUNTER — Encounter: Payer: Medicaid Other | Admitting: Physician Assistant

## 2016-09-15 DIAGNOSIS — C139 Malignant neoplasm of hypopharynx, unspecified: Secondary | ICD-10-CM | POA: Diagnosis not present

## 2016-09-15 NOTE — Progress Notes (Signed)
[name], Ryan ([id number]) Visit Report for 09/14/2016 HBO Details Patient Name: [name], [name] 09/14/2016 10:00 Date of Service: AM Medical Record [id number] Number: Patient Account Number: [id number] 1961-03-12 (55 y.o. 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 HBO Treatment Details Treatment Number: 14 Patient Type: Outpatient Chamber Type: Monoplace Chamber Serial #: [id number] Treatment Protocol: 2.0 ATA with 90 minutes oxygen, and no air breaks Treatment Details Compression Rate Down: 1.5 psi / minute De-Compression Rate Up: 1.5 psi / minute Air breaks and breathing Compress Tx Pressure periods Decompress Decompress Begins Reached (leave unused spaces Begins Ends blank) Chamber Pressure (ATA) 1 2 - - - - - - 2 1 Clock Time (24 hr) 10:26 10:37 - - - - - - 12:07 12:18 Treatment Length: 112 (minutes) Treatment Segments: 4 Capillary Blood Glucose Pre Capillary Blood Glucose (mg/dl): Post Capillary Blood Glucose (mg/dl): Vital Signs Capillary Blood Glucose Reference Range: 80 - 120 mg / dl HBO Diabetic Blood Glucose Intervention Range: <131 mg/dl or >249 mg/dl Time Vitals Blood Respiratory Capillary Blood Glucose Pulse Action Type: Pulse: Temperature: Taken: Pressure: Rate: Glucose (mg/dl): Meter #: Oximetry (%) Taken: Pre 10:15 110/76 72 16 97.8 Treatment Response [name], Ryan ([id number]) Treatment Well Toleration: Treatment Treatment Completed without Adverse Event Completion Status: HBO Attestation I certify that I supervised this HBO treatment in accordance  with Medicare guidelines. A trained Yes emergency response team is readily available per hospital policies and procedures. Continue HBOT as ordered. Yes Electronic Signature(s) Signed: 09/14/2016 4:05:26 PM By: Regan Lemming BSN, RN Signed: 09/15/2016 9:39:47 AM By: Worthy Keeler PA-C Entered By: Regan Lemming on 09/14/2016 12:19:30 [place], [place] ([id number]) -------------------------------------------------------------------------------- HBO Safety Checklist Details Patient Name: [name], Ryan 09/14/2016 10:00 Date of Service: AM Medical Record [id number] Number: Patient Account Number: [id number] [birth year] (55 y.o. Treating RN: Afful, RN, BSN, Velva Harman Date of Birth/Sex: Male) Other Clinician: Jacqulyn Bath Primary Care Physician: Nicky Pugh Treating STONE III, HOYT Referring Physician: Nicky Pugh Physician/Extender: Weeks in Treatment: 6 HBO Safety Checklist Items Safety Checklist Consent Form Signed Patient voided / foley secured and emptied When did you last eato 0800 Last dose of injectable or oral agent NA Ostomy pouch emptied and vented if applicable All implantable devices assessed, documented and approved NA Intravenous access site secured and place Valuables secured Linens and cotton and cotton/polyester blend (less than 51% polyester) Personal oil-based products / skin lotions / body lotions removed NA Wigs or hairpieces removed NA Smoking or tobacco materials removed Books / newspapers / magazines / loose paper removed Cologne, aftershave, perfume and deodorant removed Jewelry removed (may wrap wedding band) NA Make-up removed NA Hair care products removed NA Battery operated devices (external) removed NA Heating patches and chemical warmers removed NA Titanium eyewear removed NA Nail polish cured greater than 10 hours NA Casting material cured greater than 10 hours NA Hearing aids removed NA Loose dentures or partials removed NA Prosthetics have been  removed NA Patient demonstrates correct use of air break device (if applicable) Patient concerns have been addressed Patient grounding bracelet on and cord attached to chamber Specifics for Inpatients (complete in addition to above) Medication sheet sent with patient ARIES, JOPP ([id number]) Intravenous medications needed or due during  therapy sent with patient Drainage tubes (e.g. nasogastric tube or chest tube secured and vented) Endotracheal or Tracheotomy tube secured trach collar wrapped with kerlix Cuff deflated of air and inflated with saline Airway suctioned Electronic Signature(s) Signed: 09/14/2016 4:05:26 PM By: Regan Lemming BSN, RN Entered By: Regan Lemming on 09/14/2016 10:42:43

## 2016-09-15 NOTE — Progress Notes (Signed)
DELONTAY, MULANAX ([id number]) Visit Report for 09/15/2016 Arrival Information Details Patient Name: [name], [name] 09/15/2016 10:00 Date of Service: AM Medical Record [id number] Number: Patient Account Number: [id number] [birth year] (55 y.o. Treating RN: Date of Birth/Sex: Male) Other Clinician: Jacqulyn Bath Primary Care Physician: Nicky Pugh Treating STONE III, HOYT Referring Physician: Nicky Pugh Physician/Extender: Weeks in Treatment: 6 Visit Information History Since Last Visit Added or deleted any medications: No Patient Arrived: Ambulatory Any new allergies or adverse reactions: No Arrival Time: 09:45 Had a fall or experienced change in No Accompanied By: [name] activities of daily living that may affect Cab risk of falls: Transfer Assistance: None Signs or symptoms of abuse/neglect since last No Patient Identification Verified: Yes visito Secondary Verification Process Yes Hospitalized since last visit: No Completed: Pain Present Now: No Patient Requires Transmission- No Based Precautions: Patient Has Alerts: No Electronic Signature(s) Signed: 09/15/2016 12:38:41 PM By: Lorine Bears RCP, RRT, CHT Entered By: Lorine Bears on 09/15/2016 11:36:32 [place], [place] ([id number]) -------------------------------------------------------------------------------- Encounter Discharge Information Details Patient Name: [name], [name] 09/15/2016 10:00 Date of Service: AM Medical Record [id number] Number: Patient Account Number: [id number] [birth year] (55 y.o. Treating RN: Date of Birth/Sex: Male) Other Clinician: Jacqulyn Bath Primary Care Physician: Nicky Pugh Treating STONE III, HOYT Referring Physician: Nicky Pugh Physician/Extender: Weeks in Treatment: 6 Encounter Discharge Information Items Discharge Pain Level: 0 Discharge Condition: Stable Ambulatory Status: Ambulatory Nursing Discharge  Destination: Home Transportation: Other Catering manager Accompanied By: [name] Schedule Follow-up Appointment: No Medication Reconciliation completed No and provided to Patient/Care Espn Zeman: Clinical Summary of Care: Notes Patient has an HBO treatment scheduled on 09/16/16 at 10:00 am. Electronic Signature(s) Signed: 09/15/2016 12:38:41 PM By: Lorine Bears RCP, RRT, CHT Entered By: Lorine Bears on 09/15/2016 12:38:19 [place], [place] ([id number]) -------------------------------------------------------------------------------- Vitals Details Patient Name: [name], [name] 09/15/2016 10:00 Date of Service: AM Medical Record [id number] Number: Patient Account Number: [id number] 12-14-1960 (55 y.o. Treating RN: Date of Birth/Sex: Male) Other Clinician: Jacqulyn Bath Primary Care Physician: Nicky Pugh Treating STONE III, HOYT Referring Physician: Nicky Pugh Physician/Extender: Weeks in Treatment: 6 Vital Signs Time Taken: 09:55 Temperature (F): 97.9 Height (in): 68 Pulse (bpm): 78 Weight (lbs): 115 Respiratory Rate (breaths/min): 16 Body Mass Index (BMI): 17.5 Blood Pressure (mmHg): 116/74 Reference Range: 80 - 120 mg / dl Electronic Signature(s) Signed: 09/15/2016 12:38:41 PM By: Lorine Bears RCP, RRT, CHT Entered By: Lorine Bears on 09/15/2016 11:36:57

## 2016-09-16 ENCOUNTER — Encounter (HOSPITAL_BASED_OUTPATIENT_CLINIC_OR_DEPARTMENT_OTHER): Payer: Medicaid Other | Admitting: General Surgery

## 2016-09-16 DIAGNOSIS — Y842 Radiological procedure and radiotherapy as the cause of abnormal reaction of the patient, or of later complication, without mention of misadventure at the time of the procedure: Secondary | ICD-10-CM

## 2016-09-16 DIAGNOSIS — K121 Other forms of stomatitis: Secondary | ICD-10-CM | POA: Insufficient documentation

## 2016-09-16 DIAGNOSIS — C139 Malignant neoplasm of hypopharynx, unspecified: Secondary | ICD-10-CM | POA: Diagnosis not present

## 2016-09-16 DIAGNOSIS — K1379 Other lesions of oral mucosa: Secondary | ICD-10-CM | POA: Diagnosis not present

## 2016-09-16 NOTE — Progress Notes (Signed)
See I heal 

## 2016-09-16 NOTE — Progress Notes (Signed)
KYSEN, WOLSKY ([id number]) Visit Report for 09/15/2016 HBO Details Patient Name: [name], [name] 09/15/2016 10:00 Date of Service: AM Medical Record [id number] Number: Patient Account Number: [id number] May 09, 1961 (55 y.o. 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 HBO Treatment Details Treatment Number: 15 Patient Type: Outpatient Chamber Type: Monoplace Chamber Serial #: [id number] Treatment Protocol: 2.0 ATA with 90 minutes oxygen, and no air breaks Treatment Details Compression Rate Down: 1.5 psi / minute De-Compression Rate Up: 1.5 psi / minute Air breaks and breathing Compress Tx Pressure periods Decompress Decompress Begins Reached (leave unused spaces Begins Ends blank) Chamber Pressure (ATA) 1 2 - - - - - - 2 1 Clock Time (24 hr) 10:27 10:38 - - - - - - 12:08 12:19 Treatment Length: 112 (minutes) Treatment Segments: 4 Capillary Blood Glucose Pre Capillary Blood Glucose (mg/dl): Post Capillary Blood Glucose (mg/dl): Vital Signs Capillary Blood Glucose Reference Range: 80 - 120 mg / dl HBO Diabetic Blood Glucose Intervention Range: <131 mg/dl or >249 mg/dl Time Vitals Blood Respiratory Capillary Blood Glucose Pulse Action Type: Pulse: Temperature: Taken: Pressure: Rate: Glucose (mg/dl): Meter #: Oximetry (%) Taken: Pre 09:55 116/74 78 16 97.9 Post 12:24 118/82 88 16 97.9 [place], [place] ([id number]) Treatment Response Treatment Completion Status: Treatment Completed without Adverse Event Electronic Signature(s) Signed: 09/15/2016 12:38:41 PM By: Paulla Fore, RRT,  CHT Signed: 09/15/2016 4:52:13 PM By: Worthy Keeler PA-C Entered By: Lorine Bears on 09/15/2016 12:37:17 [place], [place] ([id number]) -------------------------------------------------------------------------------- HBO Safety Checklist Details Patient Name: TAMAURI, FOSBERG 09/15/2016 10:00 Date of Service: AM Medical Record [id number] Number: Patient Account Number: [id number] 05/12/1961 (55 y.o. Treating RN: Date of Birth/Sex: Male) Other Clinician: Jacqulyn Bath Primary Care Physician: Nicky Pugh Treating STONE III, HOYT Referring Physician: Nicky Pugh Physician/Extender: Weeks in Treatment: 6 HBO Safety Checklist Items Safety Checklist Consent Form Signed Patient voided / foley secured and emptied When did you last eato 07:00 tube feeding Last dose of injectable or oral agent n/a NA Ostomy pouch emptied and vented if applicable NA All implantable devices assessed, documented and approved NA Intravenous access site secured and place Valuables secured Linens and cotton and cotton/polyester blend (less than 51% polyester) Personal oil-based products / skin lotions / body lotions removed NA Wigs or hairpieces removed NA Smoking or tobacco materials removed Books / newspapers / magazines / loose paper removed Cologne, aftershave, perfume and deodorant removed Jewelry removed (may wrap wedding band) NA Make-up removed Hair care products removed Battery operated devices (external) removed Heating patches and chemical warmers removed NA Titanium eyewear removed NA Nail polish cured greater than 10 hours NA Casting material cured greater than 10 hours NA Hearing aids removed NA Loose dentures or partials removed NA Prosthetics have been removed Patient demonstrates correct use of air break device (if applicable) Patient concerns have been addressed Patient grounding bracelet on and cord attached to chamber Specifics for Inpatients (complete in addition  to above) NA Medication sheet sent with patient CHILTON, BARONA ([id number]) NA Intravenous medications needed or due during therapy sent with patient NA Drainage tubes (e.g. nasogastric tube or chest tube secured and vented) trach collar wrapped with kerlix Endotracheal or Tracheotomy tube secured to completely cover velcro NA Cuff deflated of  air and inflated with saline NA Airway suctioned Electronic Signature(s) Signed: 09/15/2016 12:38:41 PM By: Lorine Bears RCP, RRT, CHT Entered By: Lorine Bears on 09/15/2016 11:41:33

## 2016-09-17 ENCOUNTER — Encounter: Payer: Medicaid Other | Admitting: Nurse Practitioner

## 2016-09-17 DIAGNOSIS — C139 Malignant neoplasm of hypopharynx, unspecified: Secondary | ICD-10-CM | POA: Diagnosis not present

## 2016-09-17 NOTE — Progress Notes (Signed)
TASH, INES ([id number]) Visit Report for 09/16/2016 HBO Details Patient Name: [name], [name] 09/16/2016 10:00 Date of Service: AM Medical Record [id number] Number: Patient Account Number: [id number] 06/05/1961 (55 y.o. 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 HBO Treatment Details Treatment Number: 16 Patient Type: Outpatient Chamber Type: Monoplace Chamber Serial #: [id number] Treatment Protocol: 2.0 ATA with 90 minutes oxygen, and no air breaks Treatment Details Compression Rate Down: 1.5 psi / minute De-Compression Rate Up: 2.0 psi / minute Air breaks and breathing Compress Tx Pressure periods Decompress Decompress Begins Reached (leave unused spaces Begins Ends blank) Chamber Pressure (ATA) 1 2 - - - - - - 2 1 Clock Time (24 hr) 10:10 10:21 - - - - - - 11:41 11:48 Treatment Length: 98 (minutes) Treatment Segments: 3 Capillary Blood Glucose Pre Capillary Blood Glucose (mg/dl): Post Capillary Blood Glucose (mg/dl): Vital Signs Capillary Blood Glucose Reference Range: 80 - 120 mg / dl HBO Diabetic Blood Glucose Intervention Range: <131 mg/dl or >249 mg/dl Time Vitals Blood Respiratory Capillary Blood Glucose Pulse Action Type: Pulse: Temperature: Taken: Pressure: Rate: Glucose (mg/dl): Meter #: Oximetry (%) Taken: Pre 09:30 112/72 78 16 98 Post 12:05 118/80 72 18 97.6 [name], [name] ([id number]) Treatment Response Treatment Completion Status: Treatment Completed without Adverse Event Treatment Notes Patient requested to be removed from the chamber early to go to the bathroom. The pressure  was decreased with eleven minutes of treatment time left. His total treatment time was 79 minutes. Electronic Signature(s) Signed: 09/16/2016 3:04:29 PM By: Paulla Fore, RRT, CHT Signed: 09/16/2016 3:41:47 PM By: Judene Companion MD Entered By: Lorine Bears on 09/16/2016 13:30:58 [name], [name] ([id number]) -------------------------------------------------------------------------------- HBO Safety Checklist Details Patient Name: [name], [name] 09/16/2016 10:00 Date of Service: AM Medical Record [id number] Number: Patient Account Number: [id number] 17-Dec-1960 (55 y.o. Treating RN: Date of Birth/Sex: Male) Other Clinician: Jacqulyn Bath Primary Care Physician: Nicky Pugh Treating Jerline Pain, PETER Referring Physician: Nicky Pugh Physician/Extender: Suella Grove in Treatment: 6 HBO Safety Checklist Items Safety Checklist Consent Form Signed Patient voided / foley secured and emptied When did you last eato 07:00 am tube feeding Last dose of injectable or oral agent n/a NA Ostomy pouch emptied and vented if applicable NA All implantable devices assessed, documented and approved NA Intravenous access site secured and place Valuables secured Linens and cotton and cotton/polyester blend (less than 51% polyester) Personal oil-based products / skin lotions / body lotions removed NA Wigs or hairpieces removed NA Smoking or tobacco materials removed Books / newspapers / magazines / loose paper removed Cologne, aftershave, perfume and deodorant removed Jewelry removed (may wrap wedding band) NA Make-up removed Hair care products removed Battery operated devices (external) removed Heating patches and chemical warmers removed NA Titanium eyewear removed NA Nail polish cured greater than 10 hours NA Casting material cured greater than 10 hours NA Hearing aids removed NA Loose dentures or partials removed NA Prosthetics have been removed Patient demonstrates  correct use of air break device (if applicable) Patient concerns have been addressed Patient grounding bracelet on and cord attached to chamber Specifics for Inpatients (complete in addition to above) NA Medication sheet sent with patient [name], [name] ([id number]) NA Intravenous medications needed or due during therapy sent  with patient NA Drainage tubes (e.g. nasogastric tube or chest tube secured and vented) trach collar wrapped in kerlix to Endotracheal or Tracheotomy tube secured cover velcro NA Cuff deflated of air and inflated with saline NA Airway suctioned Electronic Signature(s) Signed: 09/16/2016 3:41:47 PM By: Judene Companion MD Entered By: Judene Companion on 09/16/2016 11:47:58

## 2016-09-17 NOTE — Progress Notes (Signed)
[name], [name] ([name]) Visit Report for 09/16/2016 Physician Orders Details Patient Name: [name], [name] 09/16/2016 10:00 Date of Service: AM Medical Record [name] Number: Patient Account Number: [id number] September 27, 1961 (55 y.o. 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 Male) Treating RN: Primary Care Physician: Nicky Pugh Other Clinician: Jacqulyn Bath Referring Physician: Nicky Pugh Treating Physician/Extender: Benjaman Pott in Treatment: 6 Diagnosis Coding ICD-10 Codes Code Description L59.8 Other specified disorders of the skin and subcutaneous tissue related to radiation Facility Procedures CPT4 Code: [id number] Description: (Facility Use Only) HBOT, full body chamber, 109min Modifier: Quantity: 3 Physician Procedures CPT4: Description Modifier Quantity Code [id number] - WC PHYS HYPERBARIC OXYGEN THERAPY 1 ICD-10 Description Diagnosis L59.8 Other specified disorders of the skin and subcutaneous tissue related to radiation Electronic Signature(s) Signed: 09/16/2016 3:04:29 PM By: Paulla Fore, RRT, CHT Signed: 09/16/2016 3:41:47 PM By: Judene Companion MD Entered By: Lorine Bears on 09/16/2016 12:01:06

## 2016-09-17 NOTE — Progress Notes (Signed)
MARQUINN, DIETEL ([id number]) Visit Report for 09/16/2016 Arrival Information Details Patient Name: Ryan, [name] 09/16/2016 10:00 Date of Service: AM Medical Record [id number] Number: Patient Account Number: [id number] 03/20/1961 (55 y.o. Treating RN: Date of Birth/Sex: Male) Other Clinician: Jacqulyn Bath Primary Care Physician: Nicky Pugh Treating Jerline Pain, [place] Referring Physician: Nicky Pugh Physician/Extender: Suella Grove in Treatment: 6 Visit Information History Since Last Visit Added or deleted any medications: No Patient Arrived: Ambulatory Any new allergies or adverse reactions: No Arrival Time: 09:30 Had a fall or experienced change in No Accompanied By: [name] activities of daily living that may affect Cab risk of falls: Transfer Assistance: None Signs or symptoms of abuse/neglect since last No Patient Identification Verified: Yes visito Secondary Verification Process Yes Hospitalized since last visit: No Completed: Pain Present Now: No Patient Requires Transmission- No Based Precautions: Patient Has Alerts: No Electronic Signature(s) Signed: 09/16/2016 3:04:29 PM By: Lorine Bears RCP, RRT, CHT Entered By: Lorine Bears on 09/16/2016 09:37:49 [place], [place] ([id number]) -------------------------------------------------------------------------------- Encounter Discharge Information Details Patient Name: Ryan, [name] 09/16/2016 10:00 Date of Service: AM Medical Record [id number] Number: Patient Account Number: [id number] 1961/10/13 (55 y.o. Treating RN: Date of Birth/Sex: Male) Other Clinician: Jacqulyn Bath Primary Care Physician: Nicky Pugh Treating Jerline Pain [place] Referring Physician: Nicky Pugh Physician/Extender: Suella Grove in Treatment: 6 Encounter Discharge Information Items Discharge Pain Level: 0 Discharge Condition: Stable Ambulatory Status: Ambulatory Nursing Discharge Destination: Home Transportation:  Other Catering manager Accompanied By: [name] Schedule Follow-up Appointment: No Medication Reconciliation completed No and provided to Patient/Care Bricyn Labrada: Clinical Summary of Care: Notes Patient has an HBO treatment scheduled on 09/17/16 at 10:00 am. Electronic Signature(s) Signed: 09/16/2016 3:04:29 PM By: Lorine Bears RCP, RRT, CHT Entered By: Lorine Bears on 09/16/2016 13:31:57 [place], [place] ([id number]) -------------------------------------------------------------------------------- Patient/Caregiver Education Details Patient Name: Ryan, [name] 09/16/2016 10:00 Date of Service: AM Medical Record [id number] Number: Patient Account Number: [id number] 22-Oct-1961 (55 y.o. Treating RN: Date of Birth/Gender: Male) Other Clinician: Jacqulyn Bath Primary Care Physician: Nicky Pugh Treating Judene Companion Referring Physician: Nicky Pugh Physician/Extender: Suella Grove in Treatment: 6 Education Assessment Education Provided To: Patient Education Topics Provided Electronic Signature(s) Signed: 09/16/2016 3:41:47 PM By: Judene Companion MD Entered By: Judene Companion on 09/16/2016 11:49:22 [place], [name] ([id number]) -------------------------------------------------------------------------------- [place] Details Patient Name: Ryan, [name] 09/16/2016 10:00 Date of Service: AM Medical Record [id number] Number: Patient Account Number: [id number] 1961/10/28 (55 y.o. Treating RN: Date of Birth/Sex: Male) Other Clinician: Jacqulyn Bath Primary Care Physician: Nicky Pugh Treating Jerline Pain, PET[place] Referring Physician: Nicky Pugh Physician/Extender: Weeks in Treatment: 6 Vital Signs Time Taken: 09:30 Temperature (F): 98.0 Height (in): 68 Pulse (bpm): 78 Weight (lbs): 115 Respiratory Rate (breaths/min): 16 Body Mass Index (BMI): 17.5 Blood Pressure (mmHg): 112/72 Reference Range: 80 - 120 mg / dl Electronic Signature(s) Signed: 09/16/2016  3:04:29 PM By: Lorine Bears RCP, RRT, CHT Entered By: Lorine Bears on 09/16/2016 09:38:12

## 2016-09-18 NOTE — Progress Notes (Signed)
[name], [name] ([id number]) Visit Report for 09/17/2016 Arrival Information Details Patient Name: [name], [name] 09/17/2016 10:00 Date of Service: AM Medical Record [id number] Number: Patient Account Number: [id number] Oct 21, 1961 (55 y.o. Treating RN: Date of Birth/Sex: Male) Other Clinician: Jacqulyn Bath Primary Care Physician: Nicky Pugh Treating [name] Referring Physician: Nicky Pugh Physician/Extender: Suella Grove in Treatment: 6 Visit Information History Since Last Visit Added or deleted any medications: No Patient Arrived: Ambulatory Any new allergies or adverse reactions: No Arrival Time: 09:50 Had a fall or experienced change in No Accompanied By: [name] activities of daily living that may affect Cab risk of falls: Transfer Assistance: None Signs or symptoms of abuse/neglect since last No Patient Identification Verified: Yes visito Secondary Verification Process Yes Hospitalized since last visit: No Completed: Pain Present Now: No Patient Requires Transmission- No Based Precautions: Patient Has Alerts: No Electronic Signature(s) Signed: 09/17/2016 2:16:25 PM By: Lorine Bears RCP, RRT, CHT Entered By: Lorine Bears on 09/17/2016 10:27:20 [name] ([id number]) -------------------------------------------------------------------------------- Encounter Discharge Information Details Patient Name: [name], [name] 09/17/2016 10:00 Date of Service: AM Medical Record [id number] Number: Patient Account Number: [id number] Sep 26, 1961 (55 y.o. Treating RN: Date of Birth/Sex: Male) Other Clinician: Jacqulyn Bath Primary Care Physician: Nicky Pugh Treating [name] Referring Physician: Nicky Pugh Physician/Extender: Suella Grove in Treatment: 6 Encounter Discharge Information Items Discharge Pain Level: 0 Discharge Condition: Stable Ambulatory Status: Ambulatory Nursing Discharge  Destination: Home Transportation: Other Catering manager Accompanied By: [name] Schedule Follow-up Appointment: No Medication Reconciliation completed No and provided to Patient/Care [name]: Clinical Summary of Care: Notes Patient has an HBO treatment scheduled on 09/20/16 at 10:00 am. Electronic Signature(s) Signed: 09/17/2016 2:16:25 PM By: Lorine Bears RCP, RRT, CHT Entered By: Lorine Bears on 09/17/2016 12:32:45 [name], [name] ([id number]) -------------------------------------------------------------------------------- [place] Details Patient Name: [name], [name] 09/17/2016 10:00 Date of Service: AM Medical Record [id number] Number: Patient Account Number: [id number] 1961-01-06 (55 y.o. Treating RN: Date of Birth/Sex: Male) Other Clinician: Jacqulyn Bath Primary Care Physician: Nicky Pugh Treating [name] Referring Physician: Nicky Pugh Physician/Extender: Suella Grove in Treatment: 6 Vital Signs Time Taken: 10:00 Temperature (F): 97.9 Height (in): 68 Pulse (bpm): 84 Weight (lbs): 115 Respiratory Rate (breaths/min): 18 Body Mass Index (BMI): 17.5 Blood Pressure (mmHg): 118/72 Reference Range: 80 - 120 mg / dl Electronic Signature(s) Signed: 09/17/2016 2:16:25 PM By: Lorine Bears RCP, RRT, CHT Entered By: Lorine Bears on 09/17/2016 10:27:51

## 2016-09-18 NOTE — Progress Notes (Signed)
ANTAVIUS, BURACKER ([id number]) Visit Report for 09/17/2016 HBO Details Patient Name: [name], [name] 09/17/2016 10:00 Date of Service: AM Medical Record [id number] Number: Patient Account Number: [id number] 1961/04/25 (55 y.o. 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 HBO Treatment Details Treatment Number: 17 Patient Type: Outpatient Chamber Type: Monoplace Chamber Serial #: [id number] Treatment Protocol: 2.0 ATA with 90 minutes oxygen, and no air breaks Treatment Details Compression Rate Down: 1.5 psi / minute De-Compression Rate Up: 1.5 psi / minute Air breaks and breathing Compress Tx Pressure periods Decompress Decompress Begins Reached (leave unused spaces Begins Ends blank) Chamber Pressure (ATA) 1 2 - - - - - - 2 1 Clock Time (24 hr) 10:20 10:32 - - - - - - 12:02 12:13 Treatment Length: 113 (minutes) Treatment Segments: 4 Capillary Blood Glucose Pre Capillary Blood Glucose (mg/dl): Post Capillary Blood Glucose (mg/dl): Vital Signs Capillary Blood Glucose Reference Range: 80 - 120 mg / dl HBO Diabetic Blood Glucose Intervention Range: <131 mg/dl or >249 mg/dl Time Vitals Blood Respiratory Capillary Blood Glucose Pulse Action Type: Pulse: Temperature: Taken: Pressure: Rate: Glucose (mg/dl): Meter #: Oximetry (%) Taken: Pre 10:00 118/72 84 18 97.9 Treatment Response Mauriello, [place] ([id number]) Treatment Completion Status: Treatment Completed without Adverse Event HBO Attestation I certify that I supervised this HBO treatment in accordance with Medicare guidelines. A trained Yes emergency  response team is readily available per hospital policies and procedures. Continue HBOT as ordered. Yes Electronic Signature(s) Signed: 09/17/2016 4:48:49 PM By: [name] FNP Previous Signature: 09/17/2016 2:16:25 PM Version By: Lorine Bears RCP, RRT, CHT Entered By: [name] on 09/17/2016 16:48:49 R[place], [place] ([id number]) -------------------------------------------------------------------------------- HBO Safety Checklist Details Patient Name: JAAD, BOILARD 09/17/2016 10:00 Date of Service: AM Medical Record [id number] Number: Patient Account Number: [id number] May 16, 1961 (55 y.o. Treating RN: Date of Birth/Sex: Male) Other Clinician: Jacqulyn Bath Primary Care Physician: Nicky Pugh Treating [name] Referring Physician: Nicky Pugh Physician/Extender: Suella Grove in Treatment: 6 HBO Safety Checklist Items Safety Checklist Consent Form Signed Patient voided / foley secured and emptied When did you last eato 07:00 am tube feeding Last dose of injectable or oral agent n/a NA Ostomy pouch emptied and vented if applicable NA All implantable devices assessed, documented and approved NA Intravenous access site secured and place Valuables secured Linens and cotton and cotton/polyester blend (less than 51% polyester) Personal oil-based products / skin lotions / body lotions removed NA Wigs or hairpieces removed NA Smoking or tobacco materials removed Books / newspapers / magazines / loose paper removed Cologne, aftershave, perfume and deodorant removed Jewelry removed (may wrap wedding band) NA Make-up removed Hair care products removed Battery operated devices (external) removed Heating patches and chemical warmers removed NA Titanium eyewear removed NA Nail polish cured greater than 10 hours NA Casting material cured greater than 10 hours NA Hearing aids removed NA Loose dentures or partials removed NA Prosthetics have been  removed Patient demonstrates correct use of air break device (if applicable) Patient concerns have been addressed Patient grounding bracelet on and cord attached to chamber Specifics for Inpatients (complete in addition to above) NA Medication sheet sent with patient SYIRE, DACKO ([id number]) NA Intravenous medications needed or due during therapy sent with patient NA Drainage tubes (  e.g. nasogastric tube or chest tube secured and vented) trach collar is wrapped in Endotracheal or Tracheotomy tube secured cotton kerlix gauze to cover the velcro NA Cuff deflated of air and inflated with saline NA Airway suctioned Electronic Signature(s) Signed: 09/17/2016 2:16:25 PM By: Lorine Bears RCP, RRT, CHT Entered By: Lorine Bears on 09/17/2016 10:29:15

## 2016-09-20 ENCOUNTER — Encounter (HOSPITAL_BASED_OUTPATIENT_CLINIC_OR_DEPARTMENT_OTHER): Payer: Medicaid Other | Admitting: General Surgery

## 2016-09-20 DIAGNOSIS — L598 Other specified disorders of the skin and subcutaneous tissue related to radiation: Secondary | ICD-10-CM | POA: Diagnosis not present

## 2016-09-20 DIAGNOSIS — Y842 Radiological procedure and radiotherapy as the cause of abnormal reaction of the patient, or of later complication, without mention of misadventure at the time of the procedure: Secondary | ICD-10-CM

## 2016-09-20 DIAGNOSIS — C139 Malignant neoplasm of hypopharynx, unspecified: Secondary | ICD-10-CM | POA: Diagnosis not present

## 2016-09-20 NOTE — Progress Notes (Addendum)
[name], [name] ([id number]) Visit Report for 09/20/2016 HBO Details Patient Name: [name], [name] 09/20/2016 10:00 Date of Service: AM Medical Record [id number] Number: Patient Account Number: [id number] 05-23-1961 (55 y.o. 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 HBO Treatment Details Treatment Number: 18 Patient Type: Outpatient Chamber Type: Monoplace Chamber Serial #: [id number] Treatment Protocol: 2.0 ATA with 90 minutes oxygen, and no air breaks Treatment Details Compression Rate Down: 1.5 psi / minute De-Compression Rate Up: 1.5 psi / minute Air breaks and breathing Compress Tx Pressure periods Decompress Decompress Begins Reached (leave unused spaces Begins Ends blank) Chamber Pressure (ATA) 1 2 - - - - - - 2 1 Clock Time (24 hr) 10:19 10:30 - - - - - - 12:00 12:11 Treatment Length: 112 (minutes) Treatment Segments: 4 Capillary Blood Glucose Pre Capillary Blood Glucose (mg/dl): Post Capillary Blood Glucose (mg/dl): Vital Signs Capillary Blood Glucose Reference Range: 80 - 120 mg / dl HBO Diabetic Blood Glucose Intervention Range: <131 mg/dl or >249 mg/dl Time Vitals Blood Respiratory Capillary Blood Glucose Pulse Action Type: Pulse: Temperature: Taken: Pressure: Rate: Glucose (mg/dl): Meter #: Oximetry (%) Taken: Pre 09:43 118/80 60 16 98.1 Post 12:15 106/80 84 16 97.7 [place], [place] ([id number]) Treatment Response Treatment Completion Status: Treatment Completed without Adverse Event Electronic Signature(s) Signed: 09/20/2016 12:25:44 PM By: Paulla Fore, RRT,  CHT Signed: 09/20/2016 3:40:17 PM By: Judene Companion MD Previous Signature: 09/20/2016 11:57:58 AM Version By: Judene Companion MD Entered By: Lorine Bears on 09/20/2016 12:23:13 [name], [name] ([id number]) -------------------------------------------------------------------------------- HBO Safety Checklist Details Patient Name: [name], [name] 09/20/2016 10:00 Date of Service: AM Medical Record [id number] Number: Patient Account Number: [id number] 06-05-1961 (55 y.o. Treating RN: Date of Birth/Sex: Male) Other Clinician: Jacqulyn Bath Primary Care Physician: Nicky Pugh Treating Jerline Pain, PETER Referring Physician: Nicky Pugh Physician/Extender: Suella Grove in Treatment: 6 HBO Safety Checklist Items Safety Checklist Consent Form Signed Patient voided / foley secured and emptied When did you last eato 07:00 am tube feeding Last dose of injectable or oral agent n/a NA Ostomy pouch emptied and vented if applicable NA All implantable devices assessed, documented and approved NA Intravenous access site secured and place Valuables secured Linens and cotton and cotton/polyester blend (less than 51% polyester) Personal oil-based products / skin lotions / body lotions removed NA Wigs or hairpieces removed NA Smoking or tobacco materials removed Books / newspapers / magazines / loose paper removed Cologne, aftershave, perfume and deodorant removed Jewelry removed (may wrap wedding band) NA Make-up removed Hair care products removed Battery operated devices (external) removed Heating patches and chemical warmers removed NA Titanium eyewear removed NA Nail polish cured greater than 10 hours NA Casting material cured greater than 10 hours NA Hearing aids removed NA Loose dentures or partials removed NA Prosthetics have been removed Patient demonstrates correct use of air break device (if applicable) Patient concerns have been addressed Patient grounding bracelet on and cord  attached to chamber Specifics for Inpatients (complete in addition to above) NA Medication sheet sent with patient [name], [name] ([id number]) NA Intravenous medications needed or due during therapy sent with patient NA Drainage tubes (e.g. nasogastric tube or chest tube secured and vented) trach collar wrapped with kerlix Endotracheal or Tracheotomy tube secured  to cover velcro NA Cuff deflated of air and inflated with saline NA Airway suctioned Electronic Signature(s) Signed: 09/20/2016 11:57:10 AM By: Judene Companion MD Entered By: Judene Companion on 09/20/2016 11:57:10

## 2016-09-20 NOTE — Progress Notes (Signed)
See I heal 

## 2016-09-20 NOTE — Progress Notes (Signed)
[name] ([id number]) Visit Report for 09/20/2016 Arrival Information Details Patient Name: [name], [name] 09/20/2016 10:00 Date of Service: AM Medical Record [id number] Number: Patient Account Number: [id number] Jun 17, 1961 (55 y.o. Treating RN: Date of Birth/Sex: Male) Other Clinician: [name] Primary Care Physician: [name] Treating [name] [place] Referring Physician: [name] Physician/Extender: [name] in Treatment: 6 Visit Information History Since Last Visit Added or deleted any medications: No Patient Arrived: Ambulatory Any new allergies or adverse reactions: No Arrival Time: 09:43 Had a fall or experienced change in No Accompanied By: [name] activities of daily living that may affect Cab risk of falls: Transfer Assistance: None Signs or symptoms of abuse/neglect since last No Patient Identification Verified: Yes visito Secondary Verification Process Yes Hospitalized since last visit: No Completed: [name] Present Now: No Patient Requires Transmission- No Based Precautions: Patient Has Alerts: No Electronic Signature(s) Signed: 09/20/2016 12:25:44 PM By: [name] RCP, RRT, CHT Entered By: [name] on 09/20/2016 [name], [name] ([id number]) -------------------------------------------------------------------------------- Encounter Discharge Information Details Patient Name: [name], [name] 09/20/2016 10:00 Date of Service: AM Medical Record [id number] Number: Patient Account Number: [id number] Aug 14, 1961 (55 y.o. Treating RN: Date of Birth/Sex: Male) Other Clinician: [name] Primary Care Physician: [name] Treating [name] Referring Physician: [name] Physician/Extender: [name] in Treatment: 6 Encounter Discharge Information Items Discharge [name] Level: 0 Discharge Condition: Stable Ambulatory Status: Ambulatory Nursing Discharge Destination: Home Transportation:  Other [place] Accompanied By: [name]. Schedule Follow-up Appointment: No Medication Reconciliation completed No and provided to Patient/Care Louanna Vanliew: Clinical Summary of Care: Notes Patient will go back to [facility] by way of [employer] [facility]. Patient has an HBO treatment scheduled on 09/21/16 at 10:00 am. Electronic Signature(s) Signed: 09/20/2016 12:25:44 PM By: [name] RCP, RRT, CHT Previous Signature: 09/20/2016 11:59:07 AM Version By: Ryan Companion MD Entered By: [name] on 09/20/2016 12:25:09 [name] ([id number]) -------------------------------------------------------------------------------- Patient/Caregiver Education Details Patient Name: [name], [name] 09/20/2016 10:00 Date of Service: AM Medical Record [id number] Number: Patient Account Number: [id number] 1961-04-05 (55 y.o. Treating RN: Date of Birth/Gender: Male) Other Clinician: [name] Primary Care Physician: [name] Treating [name] Referring Physician: [name] Physician/Extender: [name] in Treatment: 6 Education Assessment Education Provided To: Patient Education Topics Provided Electronic Signature(s) Signed: 09/20/2016 3:40:17 PM By: Ryan Companion MD Entered By: [name] on 09/20/2016 11:58:59 [name], [name] ([id number]) -------------------------------------------------------------------------------- Vitals Details Patient Name: [name], [name] 09/20/2016 10:00 Date of Service: AM Medical Record [id number] Number: Patient Account Number: [id number] Jun 10, 1961 (55 y.o. Treating RN: Date of Birth/Sex: Male) Other Clinician: [name] Primary Care Physician: [name] Treating [name], [name] Referring Physician: [name] Physician/Extender: Weeks in Treatment: 6 Vital Signs Time Taken: 09:43 Temperature (F): 98.1 Height (in): 68 Pulse (bpm): 60 Weight (lbs): 115 Respiratory Rate (breaths/min):  16 Body Mass Index (BMI): 17.5 Blood Pressure (mmHg): 118/80 Reference Range: 80 - 120 mg / dl Electronic Signature(s) Signed: 09/20/2016 12:25:44 PM By: [name] RCP, RRT, CHT Entered By: [name] on 09/20/2016 10:25:34

## 2016-09-20 NOTE — Progress Notes (Signed)
[name], [name] ([id number]) Visit Report for 09/20/2016 Physician Orders Details Patient Name: [name], [name] 09/20/2016 10:00 Date of Service: AM Medical Record [id number] Number: Patient Account Number: [id number] 1961/11/09 (55 y.o. 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 Treating RN: Date of Birth/Sex: Male) Other Clinician: Jacqulyn Bath Primary Care Physician: Nicky Pugh Treating Jerline Pain, [place] Referring Physician: Nicky Pugh Physician/Extender: Suella Grove in Treatment: 6 Active Problems ICD-10 Encounter Code Description Active Date Diagnosis L59.8 Other specified disorders of the skin and subcutaneous 08/03/2016 Yes tissue related to radiation Y84.2 Radiological procedure and radiotherapy as the cause of 08/03/2016 Yes abnormal reaction of the patient, or of later complication, without mention of misadventure at the time of the procedure 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 Male) Treating RN: Primary Care Physician: Nicky Pugh Other Clinician: Jacqulyn Bath Referring Physician: Nicky Pugh Treating Physician/Extender: Benjaman Pott in Treatment: 6 Diagnosis Coding ICD-10 Codes Code Description L59.8 Other specified disorders of the skin and subcutaneous tissue related to radiation Facility Procedures CPT4 Code: [id number] Description: (Facility Use Only) HBOT, full body chamber, 69min Modifier: Quantity: 4 Physician Procedures CPT4: Description Modifier Quantity Code [id number] - WC PHYS HYPERBARIC OXYGEN THERAPY 1 ICD-10 Description Diagnosis L59.8 Other specified disorders of the skin and subcutaneous tissue related to radiation Electronic Signature(s) Signed: 09/20/2016 12:25:44 PM By: Lorine Bears RCP, RRT, CHT Previous Signature: 09/20/2016 11:58:27 AM Version By: Judene Companion MD Entered By: Lorine Bears on 09/20/2016 12:23:28

## 2016-09-21 ENCOUNTER — Encounter: Payer: Medicaid Other | Admitting: Internal Medicine

## 2016-09-21 DIAGNOSIS — C139 Malignant neoplasm of hypopharynx, unspecified: Secondary | ICD-10-CM | POA: Diagnosis not present

## 2016-09-22 ENCOUNTER — Encounter: Payer: Medicaid Other | Admitting: Internal Medicine

## 2016-09-22 DIAGNOSIS — C139 Malignant neoplasm of hypopharynx, unspecified: Secondary | ICD-10-CM | POA: Diagnosis not present

## 2016-09-22 NOTE — Progress Notes (Signed)
LEELIN, WALDOCK ([id number]) Visit Report for 09/21/2016 Arrival Information Details Patient Name: [name], [name] Date of Service: 09/21/2016 10:00 AM Medical Record Patient Account Number: [id number] Number: Treating RN: June 21, 1961 (55 y.o. Other Clinician: Jacqulyn Bath Date of Birth/Sex: Male) Treating ROBSON, MICHAEL Primary Care Physician/Extender: Orland Dec, ERNEST Physician: Referring Physician: Milana Na in Treatment: 7 Visit Information History Since Last Visit Added or deleted any medications: No Patient Arrived: Ambulatory Any new allergies or adverse reactions: No Arrival Time: 09:20 Had a fall or experienced change in No Accompanied By: [name] activities of daily living that may affect Cab risk of falls: Transfer Assistance: None Signs or symptoms of abuse/neglect since last No Patient Identification Verified: Yes visito Secondary Verification Process Yes Hospitalized since last visit: No Completed: Pain Present Now: No Patient Requires Transmission- No Based Precautions: Patient Has Alerts: No Electronic Signature(s) Signed: 09/21/2016 4:03:07 PM By: Lorine Bears RCP, RRT, CHT Entered By: Lorine Bears on 09/21/2016 09:32:05 [place], [place] ([id number]) -------------------------------------------------------------------------------- Encounter Discharge Information Details Patient Name: [name] Date of Service: 09/21/2016 10:00 AM Medical Record Patient Account Number: [id number] Number: Treating RN: 1961/06/28 (55 y.o. Other Clinician: Jacqulyn Bath Date of Birth/Sex: Male) Treating ROBSON, MICHAEL Primary Care Physician/Extender: Orland Dec, ERNEST Physician: Referring Physician: Milana Na in Treatment: 7 Encounter Discharge Information Items Discharge Pain Level: 0 Discharge Condition: Stable Ambulatory Status: Ambulatory Nursing Discharge  Destination: Home Transportation: Other [name] Accompanied By: [name] Schedule Follow-up Appointment: No Medication Reconciliation completed No and provided to Patient/Care Brighid Koch: Clinical Summary of Care: Notes Patient will return to [facility] via [place]. Patient has an HBO treatment scheduled on 09/22/16 at 10:00 am. Electronic Signature(s) Signed: 09/21/2016 4:03:07 PM By: Lorine Bears RCP, RRT, CHT Entered By: Lorine Bears on 09/21/2016 12:18:19 [place], [place] ([id number]) -------------------------------------------------------------------------------- Vitals Details Patient Name: [name] Date of Service: 09/21/2016 10:00 AM Medical Record Patient Account Number: [id number] Number: Treating RN: 1961/09/16 (55 y.o. Other Clinician: Jacqulyn Bath Date of Birth/Sex: Male) Treating ROBSON, MICHAEL Primary Care Physician/Extender: Orland Dec, ERNEST Physician: Referring Physician: Nicky Pugh Weeks in Treatment: 7 Vital Signs Time Taken: 09:20 Temperature (F): 98.4 Height (in): 68 Pulse (bpm): 78 Weight (lbs): 115 Respiratory Rate (breaths/min): 16 Body Mass Index (BMI): 17.5 Blood Pressure (mmHg): 110/70 Reference Range: 80 - 120 mg / dl Electronic Signature(s) Signed: 09/21/2016 4:03:07 PM By: Lorine Bears RCP, RRT, CHT Entered By: Becky Sax, Amado Nash on 09/21/2016 09:32:25

## 2016-09-22 NOTE — Progress Notes (Signed)
ARPAD, HALDER ([id number]) Visit Report for 09/21/2016 HBO Details Patient Name: [name], [name] Date of Service: 09/21/2016 10:00 AM Medical Record Patient Account Number: [id number] Number: Treating RN: December 12, 1960 (55 y.o. 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 HBO Treatment Details Treatment Number: 19 Patient Type: Outpatient Chamber Type: Monoplace Chamber Serial #: [id number] Treatment Protocol: 2.0 ATA with 90 minutes oxygen, and no air breaks Treatment Details Compression Rate Down: 1.5 psi / minute De-Compression Rate Up: 1.5 psi / minute Air breaks and breathing Compress Tx Pressure periods Decompress Decompress Begins Reached (leave unused spaces Begins Ends blank) Chamber Pressure (ATA) 1 2 - - - - - - 2 1 Clock Time (24 hr) 10:13 10:24 - - - - - - 11:54 12:05 Treatment Length: 112 (minutes) Treatment Segments: 4 Capillary Blood Glucose Pre Capillary Blood Glucose (mg/dl): Post Capillary Blood Glucose (mg/dl): Vital Signs Capillary Blood Glucose Reference Range: 80 - 120 mg / dl HBO Diabetic Blood Glucose Intervention Range: <131 mg/dl or >249 mg/dl Time Vitals Blood Respiratory Capillary Blood Glucose Pulse Action Type: Pulse: Temperature: Taken: Pressure: Rate: Glucose (mg/dl): Meter #: Oximetry (%) Taken: Pre 09:20 110/70 78 16 98.4 Post 12:08 126/82 72 16 98.1 [name], [name] ([id number]) Treatment Response Treatment Completion Status: Treatment Completed without Adverse Event Physician Notes No concerns with treatment given HBO Attestation I certify that I supervised this  HBO treatment in accordance with Medicare guidelines. A trained Yes emergency response team is readily available per hospital policies and procedures. Continue HBOT as ordered. Yes Electronic Signature(s) Signed: 09/22/2016 8:01:59 AM By: Linton Ham MD Previous Signature: 09/21/2016 4:03:07 PM Version By: Lorine Bears RCP, RRT, CHT Entered By: Linton Ham on 09/22/2016 07:36:42 [name] ([id number]) -------------------------------------------------------------------------------- HBO Safety Checklist Details Patient Name: [name] Date of Service: 09/21/2016 10:00 AM Medical Record Patient Account Number: [id number] Number: Treating RN: [birth year] (55 y.o. Other Clinician: Jacqulyn Bath Date of Birth/Sex: Male) Treating ROBSON, MICHAEL Primary Care Physician/Extender: Orland Dec, ERNEST Physician: Referring Physician: Nicky Pugh Weeks in Treatment: 7 HBO Safety Checklist Items Safety Checklist Consent Form Signed Patient voided / foley secured and emptied When did you last eato 07:00 am tube feeding Last dose of injectable or oral agent n/a NA Ostomy pouch emptied and vented if applicable NA All implantable devices assessed, documented and approved NA Intravenous access site secured and place Valuables secured Linens and cotton and cotton/polyester blend (less than 51% polyester) Personal oil-based products / skin lotions / body lotions removed NA Wigs or hairpieces removed NA Smoking or tobacco materials removed Books / newspapers / magazines / loose paper removed Cologne, aftershave, perfume and deodorant removed Jewelry removed (may wrap wedding band) NA Make-up removed Hair care products removed Battery operated devices (external) removed Heating patches and chemical warmers removed NA Titanium eyewear removed NA Nail polish cured greater than 10 hours NA Casting material cured greater than 10 hours NA Hearing aids  removed NA Loose dentures or partials removed NA Prosthetics have been removed Patient demonstrates correct use of air break device (if applicable) Patient concerns have been addressed Patient grounding bracelet on and cord attached to chamber Specifics for Inpatients (complete in addition to above) Medication sheet sent with patient [name], [name] ([id number])  NA NA Intravenous medications needed or due during therapy sent with patient NA Drainage tubes (e.g. nasogastric tube or chest tube secured and vented) trach collar wrapped with kerlix Endotracheal or Tracheotomy tube secured gauze to cover velcro NA Cuff deflated of air and inflated with saline NA Airway suctioned Electronic Signature(s) Signed: 09/21/2016 4:03:07 PM By: Lorine Bears RCP, RRT, CHT Entered By: Lorine Bears on 09/21/2016 09:33:43

## 2016-09-22 NOTE — Progress Notes (Signed)
[name], [name] ([id number]) Visit Report for 09/22/2016 Arrival Information Details Patient Name: [name], [name] Date of Service: 09/22/2016 10:00 AM Medical Record Patient Account Number: [id number] Number: Treating RN: [birth year] (55 y.o. Other Clinician: Jacqulyn Bath Date of Birth/Sex: Male) Treating ROBSON, MICHAEL Primary Care Physician/Extender: Orland Dec, ERNEST Physician: Referring Physician: Milana Na in Treatment: 7 Visit Information History Since Last Visit Added or deleted any medications: No Patient Arrived: Ambulatory Any new allergies or adverse reactions: No Arrival Time: 09:25 Had a fall or experienced change in No Accompanied By: [name] activities of daily living that may affect Cab risk of falls: Transfer Assistance: None Signs or symptoms of abuse/neglect since last No Patient Identification Verified: Yes visito Secondary Verification Process Yes Hospitalized since last visit: No Completed: Pain Present Now: No Patient Requires Transmission- No Based Precautions: Patient Has Alerts: No Electronic Signature(s) Signed: 09/22/2016 12:14:21 PM By: Lorine Bears RCP, RRT, CHT Entered By: Lorine Bears on 09/22/2016 09:39:17 [place], [place] ([id number]) -------------------------------------------------------------------------------- Encounter Discharge Information Details Patient Name: [name] Date of Service: 09/22/2016 10:00 AM Medical Record Patient Account Number: [id number] Number: Treating RN: 1960/12/07 (55 y.o. Other Clinician: Jacqulyn Bath Date of Birth/Sex: Male) Treating ROBSON, MICHAEL Primary Care Physician/Extender: Orland Dec, ERNEST Physician: Referring Physician: Milana Na in Treatment: 7 Encounter Discharge Information Items Discharge Pain Level: 0 Discharge Condition: Stable Ambulatory Status: Ambulatory Nursing Discharge  Destination: Home Transportation: Other [name] Accompanied By: [name] Schedule Follow-up Appointment: No Medication Reconciliation completed No and provided to Patient/Care Jazmine Heckman: Clinical Summary of Care: Notes Patient will return to [facility] via [place]. Patient has an HBO treatment scheduled on 09/23/16 at 10:00 am. Electronic Signature(s) Signed: 09/22/2016 12:14:21 PM By: Lorine Bears RCP, RRT, CHT Entered By: Lorine Bears on 09/22/2016 12:14:02 [place], [name] ([id number]) -------------------------------------------------------------------------------- Vitals Details Patient Name: [name] Date of Service: 09/22/2016 10:00 AM Medical Record Patient Account Number: [id number] Number: Treating RN: 08-Nov-1961 (55 y.o. Other Clinician: Jacqulyn Bath Date of Birth/Sex: Male) Treating ROBSON, MICHAEL Primary Care Physician/Extender: Orland Dec, ERNEST Physician: Referring Physician: Nicky Pugh Weeks in Treatment: 7 Vital Signs Time Taken: 09:25 Temperature (F): 98.1 Height (in): 68 Pulse (bpm): 84 Weight (lbs): 115 Respiratory Rate (breaths/min): 16 Body Mass Index (BMI): 17.5 Blood Pressure (mmHg): 106/74 Reference Range: 80 - 120 mg / dl Electronic Signature(s) Signed: 09/22/2016 12:14:21 PM By: Lorine Bears RCP, RRT, CHT Entered By: Lorine Bears on 09/22/2016 09:39:41

## 2016-09-23 ENCOUNTER — Encounter: Payer: Medicaid Other | Admitting: Surgery

## 2016-09-23 DIAGNOSIS — C139 Malignant neoplasm of hypopharynx, unspecified: Secondary | ICD-10-CM | POA: Diagnosis not present

## 2016-09-23 NOTE — Progress Notes (Signed)
[name], [name] ([id number]) Visit Report for 09/22/2016 HBO Details Patient Name: TREA, [name] Date of Service: 09/22/2016 10:00 AM Medical Record Patient Account Number: [id number] Number: Treating RN: 1961/07/10 (55 y.o. 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 HBO Treatment Details Treatment Number: 20 Patient Type: Outpatient Chamber Type: Monoplace Chamber Serial #: [id number] Treatment Protocol: 2.0 ATA with 90 minutes oxygen, and no air breaks Treatment Details Compression Rate Down: 1.5 psi / minute De-Compression Rate Up: 1.5 psi / minute Air breaks and breathing Compress Tx Pressure periods Decompress Decompress Begins Reached (leave unused spaces Begins Ends blank) Chamber Pressure (ATA) 1 2 - - - - - - 2 1 Clock Time (24 hr) 10:09 10:19 - - - - - - 11:49 12:00 Treatment Length: 111 (minutes) Treatment Segments: 4 Capillary Blood Glucose Pre Capillary Blood Glucose (mg/dl): Post Capillary Blood Glucose (mg/dl): Vital Signs Capillary Blood Glucose Reference Range: 80 - 120 mg / dl HBO Diabetic Blood Glucose Intervention Range: <131 mg/dl or >249 mg/dl Time Vitals Blood Respiratory Capillary Blood Glucose Pulse Action Type: Pulse: Temperature: Taken: Pressure: Rate: Glucose (mg/dl): Meter #: Oximetry (%) Taken: Pre 09:25 106/74 84 16 98.1 Post 12:03 132/84 66 16 97.8 [name], [name] ([id number]) Treatment Response Treatment Completion Status: Treatment Completed without Adverse Event Physician Notes No concerns with treatment given HBO Attestation I certify that I supervised this  HBO treatment in accordance with Medicare guidelines. [name] trained Yes emergency response team is readily available per hospital policies and procedures. Continue HBOT as ordered. Yes Electronic Signature(s) Signed: 09/22/2016 5:07:12 PM By: Linton Ham MD Previous Signature: 09/22/2016 12:14:21 PM Version By: Lorine Bears RCP, RRT, CHT Entered By: Linton Ham on 09/22/2016 12:47:37 [name], [name] ([id number]) -------------------------------------------------------------------------------- HBO Safety Checklist Details Patient Name: [name] Date of Service: 09/22/2016 10:00 AM Medical Record Patient Account Number: [id number] Number: Treating RN: September 29, 1961 (55 y.o. Other Clinician: Jacqulyn Bath Date of Birth/Sex: Male) Treating ROBSON, MICHAEL Primary Care Physician/Extender: Orland Dec, ERNEST Physician: Referring Physician: Nicky Pugh Weeks in Treatment: 7 HBO Safety Checklist Items Safety Checklist Consent Form Signed Patient voided / foley secured and emptied When did you last eato 07:00 am Last dose of injectable or oral agent n/[name] NA Ostomy pouch emptied and vented if applicable NA All implantable devices assessed, documented and approved NA Intravenous access site secured and place Valuables secured Linens and cotton and cotton/polyester blend (less than 51% polyester) Personal oil-based products / skin lotions / body lotions removed NA Wigs or hairpieces removed NA Smoking or tobacco materials removed Books / newspapers / magazines / loose paper removed Cologne, aftershave, perfume and deodorant removed Jewelry removed (may wrap wedding band) NA Make-up removed Hair care products removed Battery operated devices (external) removed Heating patches and chemical warmers removed NA Titanium eyewear removed NA Nail polish cured greater than 10 hours NA Casting material cured greater than 10 hours NA Hearing aids removed NA  Loose dentures or partials removed NA Prosthetics have been removed Patient demonstrates correct use of air break device (if applicable) Patient concerns have been addressed Patient grounding bracelet on and cord attached to chamber Specifics for Inpatients (complete in addition to above) Medication sheet sent with patient [name], [name] ([id number]) NA NA  Intravenous medications needed or due during therapy sent with patient NA Drainage tubes (e.g. nasogastric tube or chest tube secured and vented) trach collar wrapped with kerlix Endotracheal or Tracheotomy tube secured gauze to cover velcro NA Cuff deflated of air and inflated with saline NA Airway suctioned Electronic Signature(s) Signed: 09/22/2016 12:14:21 PM By: Lorine Bears RCP, RRT, CHT Entered By: Lorine Bears on 09/22/2016 09:40:48

## 2016-09-23 NOTE — Progress Notes (Signed)
[name], [name] ([id number]) Visit Report for 09/23/2016 HBO Details Patient Name: [name], [name] 09/23/2016 10:00 Date of Service: AM Medical Record [id number] Number: Patient Account Number: [id number] 1961/05/09 (55 y.o. 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 HBO Treatment Details Treatment Number: 21 Patient Type: Outpatient Chamber Type: Monoplace Chamber Serial #: [id number] Treatment Protocol: 2.0 ATA with 90 minutes oxygen, and no air breaks Treatment Details Compression Rate Down: 1.5 psi / minute De-Compression Rate Up: 1.5 psi / minute Air breaks and breathing Compress Tx Pressure periods Decompress Decompress Begins Reached (leave unused spaces Begins Ends blank) Chamber Pressure (ATA) 1 2 - - - - - - 2 1 Clock Time (24 hr) 10:14 10:24 - - - - - - 11:54 12:04 Treatment Length: 110 (minutes) Treatment Segments: 4 Capillary Blood Glucose Pre Capillary Blood Glucose (mg/dl): Post Capillary Blood Glucose (mg/dl): Vital Signs Capillary Blood Glucose Reference Range: 80 - 120 mg / dl HBO Diabetic Blood Glucose Intervention Range: <131 mg/dl or >249 mg/dl Time Vitals Blood Respiratory Capillary Blood Glucose Pulse Action Type: Pulse: Temperature: Taken: Pressure: Rate: Glucose (mg/dl): Meter #: Oximetry (%) Taken: Pre 09:20 112/68 84 16 98 Post 12:05 130/82 72 16 97.6 [name], [name] ([id number]) Treatment Response Treatment Completion Status: Treatment Completed without Adverse Event HBO Attestation I certify that I supervised this HBO treatment in accordance with Medicare guidelines. A  trained Yes emergency response team is readily available per hospital policies and procedures. Continue HBOT as ordered. Yes Electronic Signature(s) Signed: 09/23/2016 12:35:38 PM By: Christin Fudge MD, FACS Entered By: Christin Fudge on 09/23/2016 12:35:38 [name], [name] ([id number]) -------------------------------------------------------------------------------- HBO Safety Checklist Details Patient Name: [name], [name] 09/23/2016 10:00 Date of Service: AM Medical Record [id number] Number: Patient Account Number: [id number] 05/22/1961 (55 y.o. Treating RN: Date of Birth/Sex: Male) Other Clinician: Jacqulyn Bath Primary Care Physician: Nicky Pugh Treating Britto, Errol Referring Physician: Nicky Pugh Physician/Extender: Suella Grove in Treatment: 7 HBO Safety Checklist Items Safety Checklist Consent Form Signed Patient voided / foley secured and emptied When did you last eato 07:00 am Last dose of injectable or oral agent n/a NA Ostomy pouch emptied and vented if applicable NA All implantable devices assessed, documented and approved NA Intravenous access site secured and place Valuables secured Linens and cotton and cotton/polyester blend (less than 51% polyester) Personal oil-based products / skin lotions / body lotions removed NA Wigs or hairpieces removed NA Smoking or tobacco materials removed Books / newspapers / magazines / loose paper removed Cologne, aftershave, perfume and deodorant removed Jewelry removed (may wrap wedding band) NA Make-up removed Hair care products removed Battery operated devices (external) removed Heating patches and chemical warmers removed NA Titanium eyewear removed NA Nail polish cured greater than 10 hours NA Casting material cured greater than 10 hours NA Hearing aids removed NA Loose dentures or partials removed NA Prosthetics have been removed Patient demonstrates correct use of air break device (if applicable) Patient concerns  have been addressed Patient grounding bracelet on and cord attached to chamber Specifics for Inpatients (complete in addition to above) NA Medication sheet sent with patient [name], [name] ([id number]) NA Intravenous medications needed or due during therapy sent with patient NA Drainage tubes (e.g. nasogastric tube or chest tube secured and  vented) trach collar is wrapped with Endotracheal or Tracheotomy tube secured kerlix gauze to cover velcro Cuff deflated of air and inflated with saline Airway suctioned Electronic Signature(s) Signed: 09/23/2016 12:35:28 PM By: Christin Fudge MD, FACS Entered By: Christin Fudge on 09/23/2016 12:35:28

## 2016-09-24 ENCOUNTER — Encounter: Payer: Medicaid Other | Admitting: Surgery

## 2016-09-24 DIAGNOSIS — C139 Malignant neoplasm of hypopharynx, unspecified: Secondary | ICD-10-CM | POA: Diagnosis not present

## 2016-09-24 NOTE — Progress Notes (Signed)
Ryan, [name] ([id number]) Visit Report for 09/23/2016 Arrival Information Details Patient Name: [name], [name] 09/23/2016 10:00 Date of Service: AM Medical Record [id number] Number: Patient Account Number: [id number] 1961/07/19 (55 y.o. Treating RN: Date of Birth/Sex: Male) Other Clinician: Jacqulyn Bath Primary Care Physician: Nicky Pugh Treating Christin Fudge Referring Physician: Nicky Pugh Physician/Extender: Suella Grove in Treatment: 7 Visit Information History Since Last Visit Added or deleted any medications: No Patient Arrived: Ambulatory Any new allergies or adverse reactions: No Arrival Time: 09:20 Had a fall or experienced change in No Accompanied By: [name] activities of daily living that may affect Cab risk of falls: Transfer Assistance: None Signs or symptoms of abuse/neglect since last No Patient Identification Verified: Yes visito Secondary Verification Process Yes Hospitalized since last visit: No Completed: Pain Present Now: No Patient Requires Transmission- No Based Precautions: Patient Has Alerts: No Electronic Signature(s) Signed: 09/23/2016 5:18:51 PM By: Lorine Bears RCP, RRT, CHT Entered By: Lorine Bears on 09/23/2016 09:50:05 [place], [place] ([id number]) -------------------------------------------------------------------------------- Encounter Discharge Information Details Patient Name: Ryan, [name] 09/23/2016 10:00 Date of Service: AM Medical Record [id number] Number: Patient Account Number: [id number] 04/20/1961 (55 y.o. Treating RN: Date of Birth/Sex: Male) Other Clinician: Jacqulyn Bath Primary Care Physician: Nicky Pugh Treating Christin Fudge Referring Physician: Nicky Pugh Physician/Extender: Suella Grove in Treatment: 7 Encounter Discharge Information Items Discharge Pain Level: 0 Discharge Condition: Stable Ambulatory Status: Ambulatory Nursing Discharge Destination: Home Transportation:  Other [name] Accompanied By: [name] Schedule Follow-up Appointment: No Medication Reconciliation completed No and provided to Patient/Care Deval Mroczka: Clinical Summary of Care: Notes Patient will return to [facility] by way of [employer] [facility]. Patient has an HBO treatment scheduled on 09/24/16 at 10:00 am. Electronic Signature(s) Signed: 09/23/2016 5:18:51 PM By: Lorine Bears RCP, RRT, CHT Entered By: Lorine Bears on 09/23/2016 12:19:44 [place], [place] ([id number]) -------------------------------------------------------------------------------- Vitals Details Patient Name: Ryan, [name] 09/23/2016 10:00 Date of Service: AM Medical Record [id number] Number: Patient Account Number: [id number] [birth year] (55 y.o. Treating RN: Date of Birth/Sex: Male) Other Clinician: Jacqulyn Bath Primary Care Physician: Nicky Pugh Treating Britto, Errol Referring Physician: Nicky Pugh Physician/Extender: Weeks in Treatment: 7 Vital Signs Time Taken: 09:20 Temperature (F): 98.0 Height (in): 68 Pulse (bpm): 84 Weight (lbs): 115 Respiratory Rate (breaths/min): 16 Body Mass Index (BMI): 17.5 Blood Pressure (mmHg): 112/68 Reference Range: 80 - 120 mg / dl Electronic Signature(s) Signed: 09/23/2016 5:18:51 PM By: Lorine Bears RCP, RRT, CHT Entered By: Lorine Bears on 09/23/2016 09:50:27

## 2016-09-25 NOTE — Progress Notes (Signed)
[name], [name] ([id number]) Visit Report for 09/24/2016 Arrival Information Details Patient Name: [name], [name] 09/24/2016 10:00 Date of Service: AM Medical Record [id number] Number: Patient Account Number: [id number] 09-Apr-1961 (55 y.o. Treating RN: Date of Birth/Sex: Male) Other Clinician: Jacqulyn Bath Primary Care Physician: Nicky Pugh Treating Christin Fudge Referring Physician: Nicky Pugh Physician/Extender: Suella Grove in Treatment: 7 Visit Information History Since Last Visit Added or deleted any medications: No Patient Arrived: Ambulatory Any new allergies or adverse reactions: No Arrival Time: 09:23 Had a fall or experienced change in No Accompanied By: [name] activities of daily living that may affect Cab risk of falls: Transfer Assistance: None Signs or symptoms of abuse/neglect since last No Patient Identification Verified: Yes visito Secondary Verification Process Yes Hospitalized since last visit: No Completed: Pain Present Now: No Patient Requires Transmission- No Based Precautions: Patient Has Alerts: No Electronic Signature(s) Signed: 09/24/2016 12:08:03 PM By: Konrad Penta By: Clinton Quant on 09/24/2016 [place], [place] ([id number]) -------------------------------------------------------------------------------- Encounter Discharge Information Details Patient Name: Ryan, [name] 09/24/2016 10:00 Date of Service: AM Medical Record [id number] Number: Patient Account Number: [id number] 07/09/1961 (55 y.o. Treating RN: Date of Birth/Sex: Male) Other Clinician: Jacqulyn Bath Primary Care Physician: Nicky Pugh Treating Christin Fudge Referring Physician: Nicky Pugh Physician/Extender: Weeks in Treatment: 7 Encounter Discharge Information Items Discharge Pain Level: 0 Discharge Condition: Stable Ambulatory Status: Ambulatory Nursing Discharge Destination: Home Transportation: Other Accompanied By: Self Schedule  Follow-up Appointment: No Medication Reconciliation completed No and provided to Patient/Care Ezriel Boffa: Clinical Summary of Care: Notes Pt was called a cab my HBO staff to transport back to [facility]. Electronic Signature(s) Signed: 09/24/2016 12:08:03 PM By: Konrad Penta By: Clinton Quant on 09/24/2016 12:07:46 [name] ([id number]) -------------------------------------------------------------------------------- Patient/Caregiver Education Details Patient Name: Ryan, [name] 09/24/2016 10:00 Date of Service: AM Medical Record [id number] Number: Patient Account Number: [id number] 1961/11/25 (55 y.o. Treating RN: Date of Birth/Gender: Male) Other Clinician: Jacqulyn Bath Primary Care Physician: Nicky Pugh Treating Christin Fudge Referring Physician: Nicky Pugh Physician/Extender: Suella Grove in Treatment: 7 Education Assessment Education Provided To: Patient Education Topics Provided Electronic Signature(s) Signed: 09/24/2016 12:08:03 PM By: Konrad Penta By: Clinton Quant on 09/24/2016 12:06:58 [name] ([id number]) -------------------------------------------------------------------------------- Vitals Details Patient Name: [name], [name] 09/24/2016 10:00 Date of Service: AM Medical Record [id number] Number: Patient Account Number: [id number] March 04, 1961 (55 y.o. Treating RN: Date of Birth/Sex: Male) Other Clinician: Jacqulyn Bath Primary Care Physician: Nicky Pugh Treating Britto, Errol Referring Physician: Nicky Pugh Physician/Extender: Weeks in Treatment: 7 Vital Signs Time Taken: 09:23 Temperature (F): 98.4 Height (in): 68 Pulse (bpm): 64 Weight (lbs): 115 Respiratory Rate (breaths/min): 16 Body Mass Index (BMI): 17.5 Blood Pressure (mmHg): 112/68 Reference Range: 80 - 120 mg / dl Electronic Signature(s) Signed: 09/24/2016 12:08:03 PM By: Konrad Penta By: Clinton Quant on 09/24/2016  11:46:23

## 2016-09-25 NOTE — Progress Notes (Signed)
JERRAMIE, BENDAVID ([id number]) Visit Report for 09/24/2016 Physician Orders Details Patient Name: [name], [name] 09/24/2016 10:00 Date of Service: AM Medical Record [id number] Number: Patient Account Number: [id number] 19-Oct-1961 (55 y.o. 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, unspecified 08/03/2016 Yes 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 Male) Treating RN: Primary Care Physician: Nicky Pugh Other Clinician: Jacqulyn Bath Referring Physician: Nicky Pugh Treating Physician/Extender: Frann Rider in Treatment: 7 Diagnosis Coding ICD-10 Codes Code Description L59.8 Other specified disorders of the skin and subcutaneous tissue related to radiation Facility Procedures CPT4 Code: [id number] Description: (Facility Use Only) HBOT, full body chamber, 57min Modifier: Quantity: 4 Physician Procedures CPT4: Description Modifier Quantity Code [id number] - WC PHYS HYPERBARIC OXYGEN THERAPY 1 ICD-10 Description Diagnosis L59.8 Other specified disorders of the skin and subcutaneous tissue related to radiation Electronic Signature(s) Signed: 09/24/2016 1:30:08 PM By: Christin Fudge MD, FACS Previous Signature: 09/24/2016 1:29:28 PM Version By: Christin Fudge MD, FACS Previous Signature: 09/24/2016 12:08:03 PM Version By: Clinton Quant Entered By: Christin Fudge on 09/24/2016 13:30:08

## 2016-09-25 NOTE — Progress Notes (Signed)
LARRIE, PERIMAN ([id number]) Visit Report for 09/24/2016 HBO Details Patient Name: [name], [name] 09/24/2016 10:00 Date of Service: AM Medical Record [id number] Number: Patient Account Number: [id number] [birth year] (55 y.o. 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 HBO Treatment Details Treatment Number: 22 Patient Type: Outpatient Chamber Type: Monoplace Chamber Serial #: [id number] Treatment Protocol: 2.0 ATA with 90 minutes oxygen, and no air breaks Treatment Details Compression Rate Down: 1.5 psi / minute De-Compression Rate Up: 1.5 psi / minute Air breaks and breathing Compress Tx Pressure periods Decompress Decompress Begins Reached (leave unused spaces Begins Ends blank) Chamber Pressure (ATA) 1 2 - - - - - - 2 1 Clock Time (24 hr) 10:03 10:15 - - - - - - 11:45 11:55 Treatment Length: 112 (minutes) Treatment Segments: 4 Capillary Blood Glucose Pre Capillary Blood Glucose (mg/dl): Post Capillary Blood Glucose (mg/dl): Vital Signs Capillary Blood Glucose Reference Range: 80 - 120 mg / dl HBO Diabetic Blood Glucose Intervention Range: <131 mg/dl or >249 mg/dl Time Vitals Blood Respiratory Capillary Blood Glucose Pulse Action Type: Pulse: Temperature: Taken: Pressure: Rate: Glucose (mg/dl): Meter #: Oximetry (%) Taken: Pre 09:23 112/68 64 16 98.4 Post 12:00 110/70 70 16 97.7 [name], [name] ([id number]) Treatment Response Treatment Well Toleration: Treatment Treatment Completed without Adverse Event Completion Status: Treatment Notes PT will continue HBO Tx Monday 09/27/2016 at 10:00  AM. HBO Attestation I certify that I supervised this HBO treatment in accordance with Medicare guidelines. A trained Yes emergency response team is readily available per hospital policies and procedures. Continue HBOT as ordered. Yes Electronic Signature(s) Signed: 09/24/2016 1:30:02 PM By: Christin Fudge MD, FACS Previous Signature: 09/24/2016 1:29:18 PM Version By: Christin Fudge MD, FACS Previous Signature: 09/24/2016 12:08:03 PM Version By: Konrad Penta By: Christin Fudge on 09/24/2016 13:30:01 [place], [place] ([id number]) -------------------------------------------------------------------------------- HBO Safety Checklist Details Patient Name: [name], [name] 09/24/2016 10:00 Date of Service: AM Medical Record [id number] Number: Patient Account Number: [id number] [birth year] (55 y.o. Treating RN: Date of Birth/Sex: Male) Other Clinician: Jacqulyn Bath Primary Care Physician: Nicky Pugh Treating Britto, Errol Referring Physician: Nicky Pugh Physician/Extender: Suella Grove in Treatment: 7 HBO Safety Checklist Items Safety Checklist Consent Form Signed Patient voided / foley secured and emptied When did you last eato 07:00 am Last dose of injectable or oral agent n/a NA Ostomy pouch emptied and vented if applicable NA All implantable devices assessed, documented and approved NA Intravenous access site secured and place Valuables secured Linens and cotton and cotton/polyester blend (less than 51% polyester) Personal oil-based products / skin lotions / body lotions removed NA Wigs or hairpieces removed NA Smoking or tobacco materials removed Books / newspapers / magazines / loose paper removed Cologne, aftershave, perfume and deodorant removed Jewelry removed (may wrap wedding band) NA Make-up removed Hair care products removed Battery operated devices (external) removed Heating patches and chemical warmers removed NA Titanium eyewear removed NA Nail polish cured  greater than 10 hours NA Casting material cured greater than 10 hours NA Hearing aids removed NA Loose dentures or partials removed NA Prosthetics have been removed Patient demonstrates correct use of air break device (if applicable) Patient concerns have been addressed Patient grounding bracelet on and cord attached to chamber Specifics for Inpatients (complete  in addition to above) NA Medication sheet sent with patient [name], [name] ([id number]) NA Intravenous medications needed or due during therapy sent with patient NA Drainage tubes (e.g. nasogastric tube or chest tube secured and vented) trach collar wrapped with kerlix Endotracheal or Tracheotomy tube secured to cover velcro NA Cuff deflated of air and inflated with saline NA Airway suctioned Electronic Signature(s) Signed: 09/24/2016 1:29:06 PM By: Christin Fudge MD, FACS Previous Signature: 09/24/2016 12:08:03 PM Version By: Konrad Penta By: Christin Fudge on 09/24/2016 13:29:06

## 2016-09-27 ENCOUNTER — Encounter: Payer: Medicaid Other | Admitting: Surgery

## 2016-09-27 DIAGNOSIS — C139 Malignant neoplasm of hypopharynx, unspecified: Secondary | ICD-10-CM | POA: Diagnosis not present

## 2016-09-27 NOTE — Progress Notes (Signed)
NUMAIR, SCHALOW ([id number]) Visit Report for 09/27/2016 Problem List Details Patient Name: [name], [name] 09/27/2016 10:00 Date of Service: AM Medical Record [id number] Number: Patient Account Number: [id number] 08/30/1961 (55 y.o. 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 Male) Treating RN: Primary Care Physician: Nicky Pugh Other Clinician: Jacqulyn Bath Referring Physician: Nicky Pugh Treating Physician/Extender: Frann Rider in Treatment: 7 Diagnosis Coding ICD-10 Codes Code Description L59.8 Other specified disorders of the skin and subcutaneous tissue related to radiation Facility Procedures CPT4 Code: [id number] Description: (Facility Use Only) HBOT, full body chamber, 104min Modifier: Quantity: 4 Physician Procedures CPT4:  Description Modifier Quantity Code [id number] - WC PHYS HYPERBARIC OXYGEN THERAPY 1 ICD-10 Description Diagnosis L59.8 Other specified disorders of the skin and subcutaneous tissue related to radiation Electronic Signature(s) Signed: 09/27/2016 1:43:01 PM By: Christin Fudge MD, FACS Entered By: Christin Fudge on 09/27/2016 13:43:01

## 2016-09-27 NOTE — Progress Notes (Signed)
[name], [name] ([id number]) Visit Report for 09/27/2016 Arrival Information Details Patient Name: [name], [name] 09/27/2016 10:00 Date of Service: AM Medical Record [id number] Number: Patient Account Number: [id number] 12/10/1960 (55 y.o. Treating RN: Date of Birth/Sex: Male) Other Clinician: Jacqulyn Bath Primary Care Physician: Nicky Pugh Treating Christin Fudge Referring Physician: Nicky Pugh Physician/Extender: Suella Grove in Treatment: 7 Visit Information History Since Last Visit Added or deleted any medications: No Patient Arrived: Ambulatory Any new allergies or adverse reactions: No Arrival Time: 09:20 Had a fall or experienced change in No Accompanied By: [name] activities of daily living that may affect Cab risk of falls: Transfer Assistance: None Signs or symptoms of abuse/neglect since last No Patient Identification Verified: Yes visito Secondary Verification Process Yes Hospitalized since last visit: No Completed: Pain Present Now: No Patient Requires Transmission- No Based Precautions: Patient Has Alerts: No Electronic Signature(s) Signed: 09/27/2016 2:30:06 PM By: Lorine Bears RCP, RRT, CHT Entered By: Lorine Bears on 09/27/2016 09:43:29 [place], [place] ([id number]) -------------------------------------------------------------------------------- Encounter Discharge Information Details Patient Name: [name], [name] 09/27/2016 10:00 Date of Service: AM Medical Record [id number] Number: Patient Account Number: [id number] 1961-03-29 (55 y.o. Treating RN: Date of Birth/Sex: Male) Other Clinician: Jacqulyn Bath Primary Care Physician: Nicky Pugh Treating Christin Fudge Referring Physician: Nicky Pugh Physician/Extender: Suella Grove in Treatment: 7 Encounter Discharge Information Items Discharge Pain Level: 0 Discharge Condition: Stable Ambulatory Status: Ambulatory Nursing Discharge Destination: Home Transportation:  Other Catering manager Accompanied By: [name] Schedule Follow-up Appointment: No Medication Reconciliation completed No and provided to Patient/Care [name]: Clinical Summary of Care: Notes Patient has an HBO treatment scheduled on 09/28/16 at 10:00 am. Electronic Signature(s) Signed: 09/27/2016 2:30:06 PM By: Lorine Bears RCP, RRT, CHT Entered By: Lorine Bears on 09/27/2016 [place], [place] ([id number]) -------------------------------------------------------------------------------- Vitals Details Patient Name: [name], [name] 09/27/2016 10:00 Date of Service: AM Medical Record [id number] Number: Patient Account Number: [id number] 24-May-1961 (55 y.o. Treating RN: Date of Birth/Sex: Male) Other Clinician: Jacqulyn Bath Primary Care Physician: Nicky Pugh Treating Britto, Errol Referring Physician: Nicky Pugh Physician/Extender: Weeks in Treatment: 7 Vital Signs Time Taken: 09:20 Temperature (F): 97.9 Height (in): 68 Pulse (bpm): 90 Weight (lbs): 115 Respiratory Rate (breaths/min): 16 Body Mass Index (BMI): 17.5 Blood Pressure (mmHg): 110/74 Reference Range: 80 - 120 mg / dl Electronic Signature(s) Signed: 09/27/2016 2:30:06 PM By: Lorine Bears RCP, RRT, CHT Entered By: Lorine Bears on 09/27/2016 09:43:53

## 2016-09-27 NOTE — Progress Notes (Signed)
EDER, STROME ([id number]) Visit Report for 09/27/2016 HBO Details Patient Name: [name], [name] 09/27/2016 10:00 Date of Service: AM Medical Record [id number] Number: Patient Account Number: [id number] [birth year] (55 y.o. 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 HBO Treatment Details Treatment Number: 23 Patient Type: Outpatient Chamber Type: Monoplace Chamber Serial #: [id number] Treatment Protocol: 2.0 ATA with 90 minutes oxygen, and no air breaks Treatment Details Compression Rate Down: 1.5 psi / minute De-Compression Rate Up: 1.5 psi / minute Air breaks and breathing Compress Tx Pressure periods Decompress Decompress Begins Reached (leave unused spaces Begins Ends blank) Chamber Pressure (ATA) 1 2 - - - - - - 2 1 Clock Time (24 hr) 10:15 10:26 - - - - - - 11:56 12:06 Treatment Length: 111 (minutes) Treatment Segments: 4 Capillary Blood Glucose Pre Capillary Blood Glucose (mg/dl): Post Capillary Blood Glucose (mg/dl): Vital Signs Capillary Blood Glucose Reference Range: 80 - 120 mg / dl HBO Diabetic Blood Glucose Intervention Range: <131 mg/dl or >249 mg/dl Time Vitals Blood Respiratory Capillary Blood Glucose Pulse Action Type: Pulse: Temperature: Taken: Pressure: Rate: Glucose (mg/dl): Meter #: Oximetry (%) Taken: Pre 09:20 110/74 90 16 97.9 Post 12:08 138/74 84 16 97.7 [name] ([id number]) Treatment Response Treatment Completion Status: Treatment Completed without Adverse Event HBO Attestation I certify that I supervised this HBO treatment in accordance with Medicare guidelines.  A trained Yes emergency response team is readily available per hospital policies and procedures. Continue HBOT as ordered. Yes Electronic Signature(s) Signed: 09/27/2016 1:42:54 PM By: Christin Fudge MD, FACS Entered By: Christin Fudge on 09/27/2016 13:42:53 [name] ([id number]) -------------------------------------------------------------------------------- HBO Safety Checklist Details Patient Name: [name], [name] 09/27/2016 10:00 Date of Service: AM Medical Record [id number] Number: Patient Account Number: [id number] 1961-08-10 (55 y.o. Treating RN: Date of Birth/Sex: Male) Other Clinician: Jacqulyn Bath Primary Care Physician: Nicky Pugh Treating Britto, Errol Referring Physician: Nicky Pugh Physician/Extender: Suella Grove in Treatment: 7 HBO Safety Checklist Items Safety Checklist Consent Form Signed Patient voided / foley secured and emptied When did you last eato 07:00 am Last dose of injectable or oral agent n/a NA Ostomy pouch emptied and vented if applicable NA All implantable devices assessed, documented and approved NA Intravenous access site secured and place Valuables secured Linens and cotton and cotton/polyester blend (less than 51% polyester) Personal oil-based products / skin lotions / body lotions removed NA Wigs or hairpieces removed NA Smoking or tobacco materials removed Books / newspapers / magazines / loose paper removed Cologne, aftershave, perfume and deodorant removed Jewelry removed (may wrap wedding band) NA Make-up removed Hair care products removed Battery operated devices (external) removed Heating patches and chemical warmers removed NA Titanium eyewear removed NA Nail polish cured greater than 10 hours NA Casting material cured greater than 10 hours NA Hearing aids removed NA Loose dentures or partials removed NA Prosthetics have been removed Patient demonstrates correct use of air break device (if applicable) Patient concerns  have been addressed Patient grounding bracelet on and cord attached to chamber Specifics for Inpatients (complete in addition to above) NA Medication sheet sent with patient [name], [name] ([id number]) NA Intravenous medications needed or due during therapy sent with patient NA Drainage tubes (e.g. nasogastric tube or chest tube secured and  vented) trach collar wrapped with kerlix Endotracheal or Tracheotomy tube secured gauze to cover velcro NA Cuff deflated of air and inflated with saline NA Airway suctioned Electronic Signature(s) Signed: 09/27/2016 2:30:06 PM By: Lorine Bears RCP, RRT, CHT Entered By: Lorine Bears on 09/27/2016 09:44:56

## 2016-09-28 ENCOUNTER — Encounter: Payer: Medicaid Other | Admitting: Internal Medicine

## 2016-09-28 DIAGNOSIS — C139 Malignant neoplasm of hypopharynx, unspecified: Secondary | ICD-10-CM | POA: Diagnosis not present

## 2016-09-28 NOTE — Progress Notes (Signed)
[name], [name] ([id number]) Visit Report for 09/28/2016 Arrival Information Details Patient Name: [name], [name] Date of Service: 09/28/2016 10:00 AM Medical Record Patient Account Number: [id number] Number: Treating RN: 03-18-1961 (54 y.o. Other Clinician: Jacqulyn Bath Date of Birth/Sex: Male) Treating ROBSON, MICHAEL Primary Care Physician/Extender: Orland Dec, ERNEST Physician: Referring Physician: Milana Na in Treatment: 8 Visit Information History Since Last Visit Added or deleted any medications: No Patient Arrived: Ambulatory Any new allergies or adverse reactions: No Arrival Time: 09:20 Had a fall or experienced change in No Accompanied By: [name] activities of daily living that may affect Cab risk of falls: Transfer Assistance: None Signs or symptoms of abuse/neglect since last No Patient Identification Verified: Yes visito Secondary Verification Process Yes Hospitalized since last visit: No Completed: Pain Present Now: No Patient Requires Transmission- No Based Precautions: Patient Has Alerts: No Electronic Signature(s) Signed: 09/28/2016 3:41:24 PM By: Lorine Bears RCP, RRT, CHT Entered By: Lorine Bears on 09/28/2016 09:35:13 [place], [place] ([id number]) -------------------------------------------------------------------------------- Encounter Discharge Information Details Patient Name: [name] Date of Service: 09/28/2016 10:00 AM Medical Record Patient Account Number: [id number] Number: Treating RN: Apr 15, 1961 (55 y.o. Other Clinician: Jacqulyn Bath Date of Birth/Sex: Male) Treating ROBSON, MICHAEL Primary Care Physician/Extender: Orland Dec, ERNEST Physician: Referring Physician: Milana Na in Treatment: 8 Encounter Discharge Information Items Discharge Pain Level: 0 Discharge Condition: Stable Ambulatory Status: Ambulatory Nursing Discharge  Destination: Home Transportation: Other [place] Accompanied BySadie Haber Schedule Follow-up Appointment: No Medication Reconciliation completed No and provided to Patient/Care Sahmir Weatherbee: Clinical Summary of Care: Notes Patient will go back to [employer] by way of [employer] [facility]. Patient has an HBO treatment scheduled on 10/25/17at 08:00am. Electronic Signature(s) Signed: 09/28/2016 3:41:24 PM By: Lorine Bears RCP, RRT, CHT Entered By: Lorine Bears on 09/28/2016 12:04:19 [place], [place] ([id number]) -------------------------------------------------------------------------------- Vitals Details Patient Name: [name] Date of Service: 09/28/2016 10:00 AM Medical Record Patient Account Number: [id number] Number: Treating RN: 04-19-1961 (55 y.o. Other Clinician: Jacqulyn Bath Date of Birth/Sex: Male) Treating ROBSON, MICHAEL Primary Care Physician/Extender: Orland Dec, ERNEST Physician: Referring Physician: Nicky Pugh Weeks in Treatment: 8 Vital Signs Time Taken: 09:25 Temperature (F): 97.8 Height (in): 68 Pulse (bpm): 78 Weight (lbs): 115 Respiratory Rate (breaths/min): 16 Body Mass Index (BMI): 17.5 Blood Pressure (mmHg): 128/78 Reference Range: 80 - 120 mg / dl Electronic Signature(s) Signed: 09/28/2016 3:41:24 PM By: Lorine Bears RCP, RRT, CHT Entered By: Lorine Bears on 09/28/2016 09:35:42

## 2016-09-29 ENCOUNTER — Encounter: Payer: Medicaid Other | Admitting: Internal Medicine

## 2016-09-29 DIAGNOSIS — C139 Malignant neoplasm of hypopharynx, unspecified: Secondary | ICD-10-CM | POA: Diagnosis not present

## 2016-09-29 NOTE — Progress Notes (Addendum)
[name] ([id number]) Visit Report for 09/29/2016 HBO Details Patient Name: [name], [name] Date of Service: 09/29/2016 8:30 AM Medical Record Patient Account Number: [id number] Number: Treating RN: [name] 07-26-1961 (54 y.o. 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 HBO Treatment Details Treatment Number: 25 Patient Type: Outpatient Chamber Type: Monoplace Chamber Serial #: [id number] Treatment Protocol: 2.0 ATA with 90 minutes oxygen, and no air breaks Treatment Details Compression Rate Down: 1.5 psi / minute De-Compression Rate Up: 1.5 psi / minute Air breaks and breathing Compress Tx Pressure periods Decompress Decompress Begins Reached (leave unused spaces Begins Ends blank) Chamber Pressure (ATA) 1 2 - - - - - - 2 1 Clock Time (24 hr) 08:35 08:44 - - - - - - 10:12 10:22 Treatment Length: 107 (minutes) Treatment Segments: 4 Capillary Blood Glucose Pre Capillary Blood Glucose (mg/dl): Post Capillary Blood Glucose (mg/dl): Vital Signs Capillary Blood Glucose Reference Range: 80 - 120 mg / dl HBO Diabetic Blood Glucose Intervention Range: <131 mg/dl or >249 mg/dl Time Vitals Blood Respiratory Capillary Blood Glucose Pulse Action Type: Pulse: Temperature: Taken: Pressure: Rate: Glucose (mg/dl): Meter #: Oximetry (%) Taken: Pre 08:25 128/72 64 16 97.6 Post 10:25 128/80 68 16 97.6 [name] ([id number]) Physician Notes No concerns with treatment given HBO Attestation I certify that I supervised this HBO treatment in accordance with Medicare guidelines. A trained Yes emergency  response team is readily available per hospital policies and procedures. Continue HBOT as ordered. Yes Electronic Signature(s) Signed: 09/29/2016 6:13:30 PM By: Ryan Ham MD Previous Signature: 09/29/2016 10:34:17 AM Version By: Ryan Cool RN, [name], [name] Previous Signature: 09/29/2016 8:47:17 AM Version By: Ryan Cool RN, [name], [name] Entered By: [name] on 09/29/2016 18:10:11 [name] ([id number]) -------------------------------------------------------------------------------- HBO Safety Checklist Details Patient Name: [name] Date of Service: 09/29/2016 8:30 AM Medical Record Patient Account Number: [id number] Number: Treating RN: [name] 09/09/1961 (54 y.o. Other Clinician: [name] Date of Birth/Sex: Male) Treating [name] Primary Care Physician/Extender: [name] Physician: Referring Physician: Nicky Pugh [name] in Treatment: 8 HBO Safety Checklist Items Safety Checklist Consent Form Signed Patient voided / foley secured and emptied When did you last eato last night Last dose of injectable or oral agent [name] Ostomy pouch emptied and vented if applicable [name] All implantable devices assessed, documented and approved [name] Intravenous access site secured and place Valuables secured Linens and cotton and cotton/polyester blend (less than 51% polyester) Personal oil-based products / skin lotions / body lotions removed [name] Wigs or hairpieces removed [name] Smoking or tobacco materials removed Books / newspapers / magazines / loose paper removed Cologne, aftershave, perfume and deodorant removed Jewelry removed (may wrap wedding band) [name] Make-up removed Hair care products removed [name] Battery operated devices (external) removed [name] Heating patches and chemical warmers removed [name] Titanium eyewear removed [name] Nail polish cured greater than 10 hours [name] Casting material cured greater than 10 hours [name] Hearing aids removed [name]  Loose dentures or partials removed [name] Prosthetics have been removed Patient demonstrates correct use of air break device (if applicable) Patient concerns have been addressed Patient grounding bracelet on and cord attached to chamber Specifics for Inpatients (complete in addition to above) [name] (  [id number]) Medication sheet sent with patient Intravenous medications needed or due during therapy sent with patient Drainage tubes (e.g. nasogastric tube or chest tube secured and vented) Endotracheal or Tracheotomy tube secured Cuff deflated of air and inflated with saline Airway suctioned Electronic Signature(s) Signed: 09/29/2016 8:46:54 AM By: Ryan Cool, RN, [name], [name] Entered By: Ryan Cool, RN, [name], Ryan on 09/29/2016 08:46:53

## 2016-09-29 NOTE — Progress Notes (Signed)
TREYDON, LOZIER ([id number]) Visit Report for 09/28/2016 HBO Details Patient Name: [name], [name] Date of Service: 09/28/2016 10:00 AM Medical Record Patient Account Number: [id number] Number: Treating RN: 09-Sep-1961 (55 y.o. 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 HBO Treatment Details Treatment Number: 24 Patient Type: Outpatient Chamber Type: Monoplace Chamber Serial #: [id number] Treatment Protocol: 2.0 ATA with 90 minutes oxygen, and no air breaks Treatment Details Compression Rate Down: 1.5 psi / minute De-Compression Rate Up: 1.5 psi / minute Air breaks and breathing Compress Tx Pressure periods Decompress Decompress Begins Reached (leave unused spaces Begins Ends blank) Chamber Pressure (ATA) 1 2 - - - - - - 2 1 Clock Time (24 hr) 10:04 10:14 - - - - - - 11:44 11:54 Treatment Length: 110 (minutes) Treatment Segments: 4 Capillary Blood Glucose Pre Capillary Blood Glucose (mg/dl): Post Capillary Blood Glucose (mg/dl): Vital Signs Capillary Blood Glucose Reference Range: 80 - 120 mg / dl HBO Diabetic Blood Glucose Intervention Range: <131 mg/dl or >249 mg/dl Time Vitals Blood Respiratory Capillary Blood Glucose Pulse Action Type: Pulse: Temperature: Taken: Pressure: Rate: Glucose (mg/dl): Meter #: Oximetry (%) Taken: Pre 09:25 128/78 78 16 97.8 Post 11:55 128/78 72 16 97.6 [place], Asher ([id number]) Treatment Response Treatment Completion Status: Treatment Completed without Adverse Event Physician Notes No concerns with treatment given HBO Attestation I certify that I supervised this  HBO treatment in accordance with Medicare guidelines. A trained Yes emergency response team is readily available per hospital policies and procedures. Continue HBOT as ordered. Yes Electronic Signature(s) Signed: 09/28/2016 5:12:34 PM By: Linton Ham MD Previous Signature: 09/28/2016 3:41:24 PM Version By: Lorine Bears RCP, RRT, CHT Entered By: Linton Ham on 09/28/2016 17:11:10 [place], [place] ([id number]) -------------------------------------------------------------------------------- HBO Safety Checklist Details Patient Name: [name] Date of Service: 09/28/2016 10:00 AM Medical Record Patient Account Number: [id number] Number: Treating RN: 02/19/1961 (55 y.o. Other Clinician: Jacqulyn Bath Date of Birth/Sex: Male) Treating ROBSON, MICHAEL Primary Care Physician/Extender: Orland Dec, ERNEST Physician: Referring Physician: Nicky Pugh Weeks in Treatment: 8 HBO Safety Checklist Items Safety Checklist Consent Form Signed Patient voided / foley secured and emptied When did you last eato 07:00 am Last dose of injectable or oral agent n/a NA Ostomy pouch emptied and vented if applicable NA All implantable devices assessed, documented and approved NA Intravenous access site secured and place Valuables secured Linens and cotton and cotton/polyester blend (less than 51% polyester) Personal oil-based products / skin lotions / body lotions removed NA Wigs or hairpieces removed NA Smoking or tobacco materials removed Books / newspapers / magazines / loose paper removed Cologne, aftershave, perfume and deodorant removed Jewelry removed (may wrap wedding band) NA Make-up removed Hair care products removed Battery operated devices (external) removed Heating patches and chemical warmers removed NA Titanium eyewear removed NA Nail polish cured greater than 10 hours NA Casting material cured greater than 10 hours NA Hearing aids removed NA Loose  dentures or partials removed NA Prosthetics have been removed Patient demonstrates correct use of air break device (if applicable) Patient concerns have been addressed Patient grounding bracelet on and cord attached to chamber Specifics for Inpatients (complete in addition to above) Medication sheet sent with patient [name], [name] ([id number]) NA NA  Intravenous medications needed or due during therapy sent with patient NA Drainage tubes (e.g. nasogastric tube or chest tube secured and vented) trach collar wrapped with kerlix Endotracheal or Tracheotomy tube secured to cover velcro NA Cuff deflated of air and inflated with saline NA Airway suctioned Electronic Signature(s) Signed: 09/28/2016 3:41:24 PM By: Lorine Bears RCP, RRT, CHT Entered By: Lorine Bears on 09/28/2016 09:37:03

## 2016-09-29 NOTE — Progress Notes (Signed)
BRISCOE, CARBIN ([id number]) Visit Report for 09/29/2016 Arrival Information Details Patient Name: RAN, SOWELLS Date of Service: 09/29/2016 8:30 AM Medical Record Patient Account Number: [id number] Number: Treating RN: Cornell Barman 1961-05-23 (55 y.o. Other Clinician: Jacqulyn Bath Date of Birth/Sex: Male) Treating ROBSON, MICHAEL Primary Care Physician/Extender: Orland Dec, ERNEST Physician: Referring Physician: Milana Na in Treatment: 8 Visit Information History Since Last Visit Added or deleted any medications: No Patient Arrived: Ambulatory Any new allergies or adverse reactions: No Arrival Time: 08:20 Had a fall or experienced change in No Accompanied By: self activities of daily living that may affect Transfer Assistance: None risk of falls: Patient Identification Verified: Yes Signs or symptoms of abuse/neglect since last No Secondary Verification Process Yes visito Completed: Hospitalized since last visit: No Patient Requires Transmission-Based No Pain Present Now: No Precautions: Patient Has Alerts: No Electronic Signature(s) Signed: 09/29/2016 8:45:19 AM By: Gretta Cool, RN, BSN, Kim RN, BSN Entered By: Gretta Cool, RN, BSN, Kim on 09/29/2016 08:45:18 [name] ([id number]) -------------------------------------------------------------------------------- Encounter Discharge Information Details Patient Name: [name] Date of Service: 09/29/2016 8:30 AM Medical Record Patient Account Number: [id number] Number: Treating RN: Cornell Barman May 16, 1961 (55 y.o. Other Clinician: Jacqulyn Bath Date of Birth/Sex: Male) Treating ROBSON, MICHAEL Primary Care Physician/Extender: Orland Dec, ERNEST Physician: Referring Physician: Milana Na in Treatment: 8 Encounter Discharge Information Items Discharge Pain Level: 0 Discharge Condition: Stable Ambulatory Status: Ambulatory Nursing Discharge Destination: Home Transportation:  Other Accompanied By: self Schedule Follow-up Appointment: Yes Medication Reconciliation completed Yes and provided to Patient/Care Noami Bove: Clinical Summary of Care: Notes Patient leaving the wound care center by [name] service. Electronic Signature(s) Signed: 09/29/2016 10:35:49 AM By: Gretta Cool, RN, BSN, Kim RN, BSN Entered By: Gretta Cool, RN, BSN, Kim on 09/29/2016 10:35:49 [place], [name] ([id number]) -------------------------------------------------------------------------------- [place] Details Patient Name: [name] Date of Service: 09/29/2016 8:30 AM Medical Record Patient Account Number: [id number] Number: Treating RN: Cornell Barman 03-28-1961 (55 y.o. Other Clinician: Jacqulyn Bath Date of Birth/Sex: Male) Treating ROBSON, MICHAEL Primary Care Physician/Extender: Orland Dec, ERNEST Physician: Referring Physician: Nicky Pugh Weeks in Treatment: 8 Vital Signs Time Taken: 08:25 Temperature (F): 97.6 Height (in): 68 Pulse (bpm): 64 Weight (lbs): 115 Respiratory Rate (breaths/min): 16 Body Mass Index (BMI): 17.5 Blood Pressure (mmHg): 128/72 Reference Range: 80 - 120 mg / dl Electronic Signature(s) Signed: 09/29/2016 8:45:34 AM By: Gretta Cool, RN, BSN, Kim RN, BSN Entered By: Gretta Cool, RN, BSN, Kim on 09/29/2016 08:45:33

## 2016-09-29 NOTE — Progress Notes (Addendum)
[name], [name] ([id number]) Visit Report for 09/29/2016 Arrival Information Details Patient Name: [name], [name] Date of Service: 09/29/2016 8:00 AM Medical Record Patient Account Number: [id number] Number: Treating RN: [name] [birth year] (55 y.o. Other Clinician: Date of Birth/Sex: Male) Treating [name] Primary Care Physician/Extender: [name] Physician: Referring Physician: Milana [name] in Treatment: 8 Visit Information History Since Last Visit Added or deleted any medications: No Patient Arrived: Ambulatory Any new allergies or adverse reactions: No Arrival Time: 08:20 Had a fall or experienced change in No Accompanied By: self activities of daily living that may affect Transfer Assistance: None risk of falls: Patient Identification Verified: Yes Signs or symptoms of abuse/neglect since last No Secondary Verification Process Yes visito Completed: Hospitalized since last visit: No Patient Requires Transmission-Based No Pain Present Now: No Precautions: Patient Has Alerts: No Electronic Signature(s) Signed: 09/29/2016 8:36:14 AM By: Ryan Cool, RN, BSN, Kim RN, BSN Entered By: [name] on 09/29/2016 08:36:14 [name] ([id number]) -------------------------------------------------------------------------------- Clinic Level of Care Assessment Details Patient Name: [name] Date of Service: 09/29/2016 8:00 AM Medical Record Patient Account Number: [id number] Number: Treating RN: [name] 1961/07/14 (55 y.o. Other Clinician: Date of Birth/Sex: Male) Treating [name] Primary Care Physician/Extender: [name] Physician: Referring Physician: Milana [name] in Treatment: 8 Clinic Level of Care Assessment Items TOOL 4 Quantity Score []  - Use when only an EandM is performed on FOLLOW-UP visit 0 ASSESSMENTS - Nursing Assessment / Reassessment []  - Reassessment of Co-morbidities (includes  updates in patient status) 0 X - Reassessment of Adherence to Treatment Plan 1 5 ASSESSMENTS - Wound and Skin Assessment / Reassessment []  - Simple Wound Assessment / Reassessment - one wound 0 []  - Complex Wound Assessment / Reassessment - multiple wounds 0 []  - Dermatologic / Skin Assessment (not related to wound area) 0 ASSESSMENTS - Focused Assessment []  - Circumferential Edema Measurements - multi extremities 0 []  - Nutritional Assessment / Counseling / Intervention 0 []  - Lower Extremity Assessment (monofilament, tuning fork, pulses) 0 []  - Peripheral Arterial Disease Assessment (using hand held doppler) 0 ASSESSMENTS - Ostomy and/or Continence Assessment and Care []  - Incontinence Assessment and Management 0 []  - Ostomy Care Assessment and Management (repouching, etc.) 0 PROCESS - Coordination of Care X - Simple Patient / Family Education for ongoing care 1 15 []  - Complex (extensive) Patient / Family Education for ongoing care 0 []  - Staff obtains Consents, Records, Test Results / Process Orders 0 [name] ([id number]) []  - Staff telephones HHA, Nursing Homes / Clarify orders / etc 0 []  - Routine Transfer to another Facility (non-emergent condition) 0 []  - Routine Hospital Admission (non-emergent condition) 0 []  - New Admissions / Biomedical engineer / Ordering NPWT, Apligraf, etc. 0 []  - Emergency Hospital Admission (emergent condition) 0 X - Simple Discharge Coordination 1 10 []  - Complex (extensive) Discharge Coordination 0 PROCESS - Special Needs []  - Pediatric / Minor Patient Management 0 []  - Isolation Patient Management 0 []  - Hearing / Language / Visual special needs 0 []  - Assessment of Community assistance (transportation, D/C planning, etc.) 0 []  - Additional assistance / Altered mentation 0 []  - Support Surface(s) Assessment (bed, cushion, seat, etc.) 0 INTERVENTIONS - Wound Cleansing / Measurement []  - Simple Wound Cleansing - one wound 0 []  - Complex  Wound Cleansing - multiple wounds 0 []  - Wound Imaging (photographs - any number of wounds) 0 []  - Wound Tracing (instead of photographs) 0 []  - Simple  Wound Measurement - one wound 0 []  - Complex Wound Measurement - multiple wounds 0 INTERVENTIONS - Wound Dressings []  - Small Wound Dressing one or multiple wounds 0 []  - Medium Wound Dressing one or multiple wounds 0 []  - Large Wound Dressing one or multiple wounds 0 []  - Application of Medications - topical 0 []  - Application of Medications - injection 0 [name], [name] ([id number]) INTERVENTIONS - Miscellaneous []  - External ear exam 0 []  - Specimen Collection (cultures, biopsies, blood, body fluids, etc.) 0 []  - Specimen(s) / Culture(s) sent or taken to Lab for analysis 0 []  - Patient Transfer (multiple staff / [name] Lift / Similar devices) 0 []  - Simple Staple / Suture removal (25 or less) 0 []  - Complex Staple / Suture removal (26 or more) 0 []  - Hypo / Hyperglycemic Management (close monitor of Blood Glucose) 0 []  - Ankle / Brachial Index (ABI) - do not check if billed separately 0 X - Vital Signs 1 5 Has the patient been seen at the hospital within the last three years: Yes Total Score: 35 Level Of Care: New/Established - Level 1 Electronic Signature(s) Signed: 09/29/2016 10:06:37 AM By: Ryan Cool, RN, BSN, Kim RN, BSN Entered By: [name] on 09/29/2016 08:41:40 [name] ([id number]) -------------------------------------------------------------------------------- Encounter Discharge Information Details Patient Name: [name] Date of Service: 09/29/2016 8:00 AM Medical Record Patient Account Number: [id number] Number: Treating RN: [name] [birth year] (55 y.o. Other Clinician: Date of Birth/Sex: Male) Treating [name] Primary Care Physician/Extender: [name] Physician: Referring Physician: Milana [name] in Treatment: 8 Encounter Discharge Information Items Discharge Pain  Level: 0 Discharge Condition: Stable Ambulatory Status: Ambulatory Other (Note Discharge Destination: Required) Transportation: Other Accompanied By: self Schedule Follow-up Appointment: Yes Medication Reconciliation completed and provided to Patient/Care Yes Nevyn Bossman: Clinical Summary of Care: Notes 09/29/2016: Patient has HBO treatment today following MD assessment. Electronic Signature(s) Signed: 09/29/2016 8:44:39 AM By: Ryan Cool, RN, BSN, Kim RN, BSN Previous Signature: 09/29/2016 8:44:10 AM Version By: Ryan Cool, RN, BSN, Kim RN, BSN Entered By: [name] on 09/29/2016 08:44:39 [place], Elta [name] ([id number]) -------------------------------------------------------------------------------- Multi-Disciplinary Care Plan Details Patient Name: [name] Date of Service: 09/29/2016 8:00 AM Medical Record Patient Account Number: [id number] Number: Treating RN: [name] 1961-04-21 (54 y.o. Other Clinician: Date of Birth/Sex: Male) Treating [name] Primary Care Physician/Extender: [name] Physician: Referring Physician: Nicky Pugh Weeks in Treatment: 8 Active Inactive Electronic Signature(s) Signed: 10/21/2016 1:09:10 PM By: Ryan Cool RN, BSN, Kim RN, BSN Previous Signature: 09/29/2016 8:39:08 AM Version By: Ryan Cool RN, BSN, Kim RN, BSN Previous Signature: 09/29/2016 8:38:13 AM Version By: Ryan Cool RN, BSN, Kim RN, BSN Entered By: [name] on 10/21/2016 13:09:09 [name] ([id number]) -------------------------------------------------------------------------------- Non-Wound Condition Assessment Details Patient Name: [name] Date of Service: 09/29/2016 8:00 AM Medical Record Patient Account Number: [id number] Number: Treating RN: [name] 21-Sep-1961 (54 y.o. Other Clinician: Date of Birth/Sex: Male) Treating [name] Primary Care Physician/Extender: [name] Physician: Referring Physician: Nicky Pugh Weeks in Treatment: 8 Non-Wound Condition: Condition: Other Dermatologic Condition Location: Other: larynyx (radiation damage) Side: Periwound Skin Texture Texture Color No Abnormalities Noted: No No Abnormalities Noted: No Moisture No Abnormalities Noted: No Notes MD states patient's voice is much stronger and internal swelling is much better. Electronic Signature(s) Signed: 09/29/2016 8:38:02 AM By: Ryan Cool, RN, BSN, Kim RN, BSN Entered By: [name] on 09/29/2016 08:38:02 [place], Elta [name] ([id number]) -------------------------------------------------------------------------------- Pain Assessment Details Patient Name: [name],  [name] Date of Service: 09/29/2016 8:00 AM Medical Record Patient Account Number: [id number] Number: Treating RN: [name] 06-29-1961 [id number] y.o. Other Clinician: Date of Birth/Sex: Male) Treating [name] Primary Care Physician/Extender: [name] Physician: Referring Physician: Nicky Pugh Weeks in Treatment: 8 Active Problems Location of Pain Severity and Description of Pain Patient Has Paino No Site Locations With Dressing Change: No Pain Management and Medication Current Pain Management: Electronic Signature(s) Signed: 09/29/2016 8:36:21 AM By: Ryan Cool, RN, BSN, Kim RN, BSN Entered By: [name] on 09/29/2016 08:36:21 [place], Elta [name] ([id number]) -------------------------------------------------------------------------------- Patient/Caregiver Education Details Patient Name: [name] Date of Service: 09/29/2016 8:00 AM Medical Record Patient Account Number: [id number] Number: Treating RN: [name] 1961-06-25 (54 y.o. Other Clinician: Date of Birth/Gender: Male) Treating ROBSON, [place] Primary Care Physician: Nicky Pugh Physician/Extender: G Referring Physician: Milana [name] in Treatment: 8 Education Assessment Education Provided To: Patient Education Topics  Provided Hyperbaric Oxygenation: Handouts: Hyperbaric Oxygen Methods: Explain/Verbal Responses: State content correctly Electronic Signature(s) Signed: 09/29/2016 10:06:37 AM By: Ryan Cool, RN, BSN, Kim RN, BSN Entered By: [name] on 09/29/2016 08:44:50 [place], Elta [name] ([id number]) -------------------------------------------------------------------------------- Vitals Details Patient Name: [name] Date of Service: 09/29/2016 8:00 AM Medical Record Patient Account Number: [id number] Number: Treating RN: [name] [birth year] (54 y.o. Other Clinician: Date of Birth/Sex: Male) Treating [name] Primary Care Physician/Extender: [name] Physician: Referring Physician: Nicky Pugh Weeks in Treatment: 8 Vital Signs Time Taken: 08:25 Temperature (F): 97.6 Height (in): 68 Pulse (bpm): 64 Weight (lbs): 115 Respiratory Rate (breaths/min): 16 Body Mass Index (BMI): 17.5 Blood Pressure (mmHg): 128/72 Reference Range: 80 - 120 mg / dl Electronic Signature(s) Signed: 09/29/2016 8:36:45 AM By: Ryan Cool, RN, BSN, Kim RN, BSN Entered By: [name] on 09/29/2016 08:36:44

## 2016-09-30 ENCOUNTER — Encounter: Payer: Medicaid Other | Admitting: Surgery

## 2016-09-30 DIAGNOSIS — C139 Malignant neoplasm of hypopharynx, unspecified: Secondary | ICD-10-CM | POA: Diagnosis not present

## 2016-09-30 NOTE — Progress Notes (Signed)
[name], [name] ([id number]) Visit Report for 09/30/2016 Arrival Information Details Patient Name: [name], [name] Date of Service: 09/30/2016 8:00 AM Medical Record Number: [id number] Patient Account Number: [id number] Date of Birth/Sex: [birth year] (55 y.o. Male) Treating RN: Primary Care Physician: Nicky Pugh Other Clinician: Referring Physician: Nicky Pugh Treating Physician/Extender: Frann Rider in Treatment: 8 Visit Information History Since Last Visit All ordered tests and consults were completed: No Patient Arrived: Ambulatory Added or deleted any medications: No Arrival Time: 08:15 Any new allergies or adverse reactions: No Accompanied By: Self Had a fall or experienced change in No Transfer Assistance: None activities of daily living that may affect Patient Identification Verified: Yes risk of falls: Secondary Verification Process Yes Signs or symptoms of abuse/neglect since last No Completed: visito Patient Requires Transmission-Based No Hospitalized since last visit: No Precautions: Pain Present Now: No Patient Has Alerts: No Electronic Signature(s) Signed: 09/30/2016 10:57:55 AM By: Konrad Penta By: Clinton Quant on 09/30/2016 10:30:24 [name] ([id number]) -------------------------------------------------------------------------------- Encounter Discharge Information Details Patient Name: [name] Date of Service: 09/30/2016 8:00 AM Medical Record Number: [id number] Patient Account Number: [id number] Date of Birth/Sex: 01/22/1961 (55 y.o. Male) Treating RN: Primary Care Physician: Nicky Pugh Other Clinician: Referring Physician: Nicky Pugh Treating Physician/Extender: Frann Rider in Treatment: 8 Encounter Discharge Information Items Discharge Pain Level: 0 Discharge Condition: Stable Ambulatory Status: Ambulatory Discharge Destination: Home Transportation: Other Accompanied By: Self Schedule Follow-up  Appointment: No Medication Reconciliation completed No and provided to Patient/Care [name]: Patient Clinical Summary of Care: Declined Electronic Signature(s) Signed: 09/30/2016 10:57:55 AM By: Konrad Penta By: Clinton Quant on 09/30/2016 10:34:07 [name] ([id number]) -------------------------------------------------------------------------------- Patient/Caregiver Education Details Patient Name: [name] Date of Service: 09/30/2016 8:00 AM Medical Record Number: [id number] Patient Account Number: [id number] Date of Birth/Gender: 05-09-1961 (55 y.o. Male) Treating RN: Primary Care Physician: Nicky Pugh Other Clinician: Referring Physician: Nicky Pugh Treating Physician/Extender: Frann Rider in Treatment: 8 Education Assessment Education Provided To: Patient Education Topics Provided Electronic Signature(s) Signed: 09/30/2016 10:57:55 AM By: Konrad Penta By: Clinton Quant on 09/30/2016 10:33:45 [name], [name] ([id number]) -------------------------------------------------------------------------------- Vitals Details Patient Name: [name] Date of Service: 09/30/2016 8:00 AM Medical Record Number: [id number] Patient Account Number: [id number] Date of Birth/Sex: 1961/11/15 (55 y.o. Male) Treating RN: Primary Care Physician: Nicky Pugh Other Clinician: Referring Physician: Nicky Pugh Treating Physician/Extender: Frann Rider in Treatment: 8 Vital Signs Time Taken: 08:15 Temperature (F): 98.4 Height (in): 68 Pulse (bpm): 80 Source: Stated Respiratory Rate (breaths/min): 18 Weight (lbs): 115 Blood Pressure (mmHg): 132/76 Source: Stated Reference Range: 80 - 120 mg / dl Body Mass Index (BMI): 17.5 Electronic Signature(s) Signed: 09/30/2016 10:57:55 AM By: Konrad Penta By: Clinton Quant on 09/30/2016 10:30:46

## 2016-09-30 NOTE — Progress Notes (Signed)
[name], [name] ([id number]) Visit Report for 09/29/2016 Chief Complaint Document Details Patient Name: [name], [name] Date of Service: 09/29/2016 8:00 AM Medical Record Patient Account Number: [id number] Number: Treating RN: [name] 02-16-1961 (54 y.o. Other Clinician: Date of Birth/Sex: Male) Treating [name] Primary Care Physician/Extender: [name] Physician: Referring Physician: [name] in Treatment: 8 Information Obtained from: Patient Chief Complaint Patient is here for consultation with regards to soft tissue radionecrosis of Ryan larynx Electronic Signature(s) Signed: 09/29/2016 6:13:30 PM By: Ryan Ham MD Entered By: [name] on 09/29/2016 08:40:38 [name] ([id number]) -------------------------------------------------------------------------------- HPI Details Patient Name: [name] Date of Service: 09/29/2016 8:00 AM Medical Record Patient Account Number: [id number] Number: Treating RN: [name] 17-Apr-1961 (54 y.o. Other Clinician: Date of Birth/Sex: Male) Treating [name] Primary Care Physician/Extender: [name] Physician: Referring Physician: [name] in Treatment: 8 History of Present Illness HPI Description: 08/03/16; this is a 55 year old man who was referred by his otolaryngologist Dr. 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 Per Ryan patient this was diagnosed in February of this year. He was a 37 year pack year smoker to that point but has stopped smoking since that time. He was treated with both radiation and chemotherapy at [name]. Unfortunately I have none of these details. He apparently completed his treatment regimen in on 04/23/16. Ryan patient was living at home apparently functioning on his own and [name]. He tells me initially things went well however he developed increasing difficulties talking and swallowing with Ryan throat pain and he was admitted to [name] at [name] in July. No specific details of this hospitalization however on 07/19/16 he required placement of a PEG tube for nutritional support. Patient tells me that he can only take liquids and cannot swallow solid food. He is listed as having [name]. He required placement of a tracheostomy on 8/10 secondary to significant laryngeal edema which was progressive with airway compromise. Laryngoscopy on 07/12/16 showed significant laryngeal edema so that his vocal cords could not be specifically inspected secondary to edema of Ryan arytenoid process. He has since been admitted to [name] skilled facility. He is dependent on peg tube feedings. He has a tracheostomy with a [name] valve and trach collar oxygen Other than Ryan history of cancer Ryan patient's medical history seems unremarkable. He does not carry a history of COPD in spite of his smoking history no cardiac issues. He is not a diabetic. He has stopped smoking I really hope to be able to get into care everywhere to look more specifically at his [name] records however it may be too soon I could not access any records through [name]. Currently I do not have his exact Ryan radiation dosage and over what time frame specifically. Ryan patient describes pain in his throat and points to his upper neck area for discomfort. He is able to speak with aid of [name] valve and his speech is audible. 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 In general he has done well. He follows  with Dr. [facility]verely [name] at [name]. I have reviewed a note from an office  visit dated 09/10/16 and care everywhere. At that point he had endoscopy. It was noted that Ryan edema in his larynx was a lot better. It was felt during that visit that Ryan edema in his neck was better. Listening to him speak with his tracheostomy is voices certainly a lot stronger than when I saw him for his intake exam [name] ([id number]) Electronic Signature(s) Signed: 09/29/2016 6:13:30 PM By: Ryan Ham MD Entered By: [name] on 09/29/2016 08:44:11 [name], [name] ([id number]) -------------------------------------------------------------------------------- Physical Exam Details Patient Name: [name] Date of Service: 09/29/2016 8:00 AM Medical Record Patient Account Number: [id number] Number: Treating RN: [name] 1961/09/15 (54 y.o. Other Clinician: Date of Birth/Sex: Male) Treating Britton Bera Primary Care Physician/Extender: [name] Physician: Referring Physician: [name] in Treatment: 8 Constitutional Sitting or standing Blood Pressure is within target range for patient.. Pulse regular and within target range for patient.Marland Kitchen Respirations regular, non-labored and within target range.. Temperature is normal and within Ryan target range for Ryan patient.. Patient looks well. Ears, Nose, Mouth, and Throat . Neck no masses felt. Respiratory Respiratory effort is easy and symmetric bilaterally. Rate is normal at rest and on room air.. capped trach. phonation is noormal. no wheeziing, no stridor. Cardiovascular Heart rhythm and rate regular, without murmur or gallop.Marland Kitchen Lymphatic none palpable in Ryan neck,submadibular. Psychiatric No evidence of depression, anxiety, or agitation. Calm, cooperative, and communicative. Appropriate interactions and affect.. Notes 09/29/16 Ryan patient appears to be doing well. Phonation is certainly better. There is no  wheezing no stridor. Recent ENT follow-up at [name] showed improved laryngeal edema on laryngoscopy. Ryan patient is unaware, and I cannot see any plan, for tracheostomy removal. Patient complains of edema anteriorly in his neck although his ENT review 2 and half [name] ago said this was improved Electronic Signature(s) Signed: 09/29/2016 6:13:30 PM By: Ryan Ham MD Entered By: [name] on 09/29/2016 08:47:18 [place], [place] ([id number]) -------------------------------------------------------------------------------- Physician Orders Details Patient Name: [name] Date of Service: 09/29/2016 8:00 AM Medical Record Patient Account Number: [id number] Number: Treating RN: [name] 1961/07/21 (54 y.o. Other Clinician: Date of Birth/Sex: Male) Treating Carlita Whitcomb Primary Care Physician/Extender: [name] Physician: Referring Physician: Milana Na in Treatment: 8 Verbal / Phone Orders: Yes Clinician: Cornell [name] Read Back and Verified: Yes Diagnosis Coding Hyperbaric Oxygen Therapy o Indication: - RadioNecrosis of neck/larynx o If appropriate for treatment, begin HBOT per protocol: - RadioNecrosis of neck/larynx o 2.0 ATA for 90 Minutes without Air Breaks o One treatment per day (delivered Monday through Friday unless otherwise specified in Special Instructions below): o Total # of Treatments: - 30 HBO Contraindications o HBO contraindications of hyperbaric oxygen therapy were reviewed and Ryan patient found to have no untreated pneumothorax or history of spontaneous pneumothorax. o HBO contraindications of hyperbaric oxygen therapy were reviewed and Ryan patient found to have no history of medications such as Bleomycin, Adriamycin, disulfiram, cisplatin and sulfamylon and is not currently receiving any chemotherapy. o HBO contraindications of hyperbaric oxygen therapy were reviewed and Ryan patient found to have no Upper  respiratory infection and chronic sinusitis. o HBO contraindications of hyperbaric oxygen therapy were reviewed and Ryan patient found to have no history of retinal surgery proceeding 6 [name] or intraocular gas o HBO contraindications of hyperbaric oxygen therapy were reviewed and Ryan patient found to have no history seizure disorder or any anticonvulsant medication. o HBO contraindications of hyperbaric oxygen therapy were reviewed and Ryan patient found to have no  septicemia with CO2 retention. o HBO contraindications of hyperbaric oxygen therapy were reviewed and Ryan patient found to have no fever greater than 100 degrees. o HBO contraindications of hyperbaric oxygen therapy were reviewed and Ryan patient found to have no pregnancy noted. o HBO contraindications of hyperbaric oxygen therapy were reviewed and Ryan patient found to have no medications such as steroids or narcotics or Phenergan. Electronic Signature(s) Signed: 09/29/2016 8:40:41 AM By: Gretta Cool RN, BSN, Kim RN, BSN Signed: 09/29/2016 6:13:30 PM By: Ryan Ham MD Entered By: Gretta Cool RN, BSN, Kim on 09/29/2016 08:40:41 [place], [place] ([id number]) [place], [place] ([id number]) -------------------------------------------------------------------------------- Problem List Details Patient Name: [name] Date of Service: 09/29/2016 8:00 AM Medical Record Patient Account Number: [id number] Number: Treating RN: [name] October 18, 1961 (54 y.o. 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 Other Clinician: Date of Birth/Sex: Male) Treating Harsh Trulock Primary Care Physician/Extender: [name] Physician: Referring Physician: [name] in Treatment: 8 Subjective Chief Complaint Information obtained from Patient Patient is here for consultation with regards to soft tissue radionecrosis of Ryan larynx History of Present Illness (HPI) 08/03/16; this is a 55 year old man who was referred by his otolaryngologist Dr. 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 Per Ryan patient this was diagnosed in February of this year. He was a 37 year pack year smoker to that point but has stopped smoking since that time. He was treated with both radiation and chemotherapy at [name]. Unfortunately I have none of these details. He apparently completed his treatment regimen in on 04/23/16. Ryan patient was living at home apparently functioning on his own and [place]. He tells me initially things went well however he developed increasing difficulties talking and swallowing with Ryan throat pain and he was admitted to [name] at  [name] in July. No specific details of this hospitalization however on 07/19/16 he required placement of a PEG tube for nutritional support. Patient tells me that he can only take liquids and cannot swallow solid food. He is listed as having [name]. He required placement of a tracheostomy on 8/10 secondary to significant laryngeal edema which was progressive with airway compromise. Laryngoscopy on 07/12/16 showed significant laryngeal edema so that his vocal cords could not be specifically inspected secondary to edema of Ryan arytenoid process. He has since been admitted to [facility] care skilled facility. He is dependent on peg tube feedings. He has a tracheostomy with a [name] valve and trach collar oxygen Other than Ryan history of cancer Ryan patient's medical history seems unremarkable. He does  not carry a history of COPD in spite of his smoking history no cardiac issues. He is not a diabetic. He has stopped smoking I really hope to be able to get into care everywhere to look more specifically at his [facility] records however it may be too soon I could not access any records through [place]. Currently I do not have his exact Ryan radiation dosage and over what time frame specifically. Ryan patient describes pain in his throat and points to his upper neck area for discomfort. He is able to speak with aid of [name] valve and his speech is audible. 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 In [place], [place] ([id number]) general he has done well. He follows with Dr. Beverely [name] at [name]. I have reviewed a note from an office visit dated 09/10/16 and care everywhere. At that point he had endoscopy. It  was noted that Ryan edema in his larynx was a lot better. It was felt during that visit that Ryan edema in his neck was better. Listening to him speak with his tracheostomy is voices certainly a lot stronger than when I saw him for his intake exam Objective Constitutional Sitting or standing Blood Pressure is within target range for patient.. Pulse regular and within target range for patient.Marland Kitchen Respirations regular, non-labored and within target range.. Temperature is normal and within Ryan target range for Ryan patient.. Patient looks well. Vitals Time Taken: 8:25 AM, Height: 68 in, Weight: 115 lbs, BMI: 17.5, Temperature: 97.6 F, Pulse: 64 bpm, Respiratory Rate: 16 breaths/min, Blood Pressure: 128/72 mmHg. Neck no masses felt. Respiratory Respiratory effort is easy and symmetric bilaterally. Rate is normal at rest and on room air.. capped trach. phonation is noormal. no wheeziing, no stridor. Cardiovascular Heart rhythm and rate regular, without murmur or gallop.Marland Kitchen Lymphatic none palpable in Ryan neck,submadibular. Psychiatric No evidence of depression, anxiety, or agitation. Calm, cooperative, and communicative. Appropriate interactions and affect.. General Notes: 09/29/16 Ryan patient appears to be doing well. Phonation is certainly better. There is no wheezing no stridor. Recent ENT follow-up at [name] showed improved laryngeal edema on laryngoscopy. Ryan patient is unaware, and I cannot see any plan, for tracheostomy removal. Patient complains of edema anteriorly in his neck although his ENT review 2 and half [name] ago said this was improved Assessment [name], [name] ([id number]) Active Problems ICD-10 L59.8 - Other specified disorders of Ryan skin and subcutaneous tissue related to radiation Y84.2 - Radiological procedure and radiotherapy as Ryan cause of abnormal reaction of Ryan patient, or of later complication, without mention of misadventure at Ryan time of Ryan procedure C13.9 -  Malignant neoplasm of hypopharynx, unspecified Z93.1 - Gastrostomy status Z93.0 - Tracheostomy status Plan Hyperbaric Oxygen Therapy: Indication: - RadioNecrosis of neck/larynx If appropriate for treatment, begin HBOT per protocol: - RadioNecrosis of neck/larynx 2.0 ATA for 90 Minutes without Air Breaks One treatment per day (delivered Monday through Friday unless otherwise specified in Special Instructions below): Total # of Treatments: - 30 HBO Contraindications: HBO contraindications of hyperbaric oxygen therapy were reviewed and Ryan patient found to have no untreated pneumothorax or history of spontaneous pneumothorax. HBO contraindications of hyperbaric oxygen therapy were reviewed and Ryan patient found to have no history of medications such as Bleomycin, Adriamycin, disulfiram, cisplatin and sulfamylon  and is not currently receiving any chemotherapy. HBO contraindications of hyperbaric oxygen therapy were reviewed and Ryan patient found to have no Upper respiratory infection and chronic sinusitis. HBO contraindications of hyperbaric oxygen therapy were reviewed and Ryan patient found to have no history of retinal surgery proceeding 6 [name] or intraocular gas HBO contraindications of hyperbaric oxygen therapy were reviewed and Ryan patient found to have no history seizure disorder or any anticonvulsant medication. HBO contraindications of hyperbaric oxygen therapy were reviewed and Ryan patient found to have no septicemia with CO2 retention. HBO contraindications of hyperbaric oxygen therapy were reviewed and Ryan patient found to have no fever greater than 100 degrees. HBO contraindications of hyperbaric oxygen therapy were reviewed and Ryan patient found to have no pregnancy noted. HBO contraindications of hyperbaric oxygen therapy were reviewed and Ryan patient found to have no medications such as steroids or narcotics or Phenergan. [place], [place] ([id number]) #1 patient is to of done  well with his hyperbaric treatments. His 30 approved Medicaid dives will complete in one week. #2 he has not had any difficulties related to hyperbaric oxygen treatment Electronic Signature(s) Signed: 09/29/2016 6:13:30 PM By: Ryan Ham MD Entered By: [name] on 09/29/2016 08:48:07 [name] ([id number]) -------------------------------------------------------------------------------- [place] Details Patient Name: [name] Date of Service: 09/29/2016 Medical Record Patient Account Number: [id number] Number: Treating RN: [name] 06-06-1961 (54 y.o. 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

## 2016-10-01 ENCOUNTER — Encounter: Payer: Medicaid Other | Admitting: Surgery

## 2016-10-01 DIAGNOSIS — C139 Malignant neoplasm of hypopharynx, unspecified: Secondary | ICD-10-CM | POA: Diagnosis not present

## 2016-10-01 NOTE — Progress Notes (Signed)
DINERO, TLATELPA ([id number]) Visit Report for 09/30/2016 Physician Orders Details Patient Name: [name], [name] Date of Service: 09/30/2016 8:00 AM Medical Record Number: [id number] Patient Account Number: [id number] Date of Birth/Sex: [birth year] (55 y.o. 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, unspecified 08/03/2016 Yes 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 Facility Procedures CPT4 Code: [id number] Description: (Facility Use Only) HBOT, full body chamber, 110min Modifier: Quantity: 4 Physician Procedures CPT4: Description Modifier Quantity Code [id number] - WC PHYS HYPERBARIC OXYGEN THERAPY 1 ICD-10 Description Diagnosis L59.8 Other specified disorders of the skin and subcutaneous tissue related to radiation Y84.2 Radiological procedure and  radiotherapy as the cause of abnormal reaction of the patient, or of later complication, without mention of misadventure at the time of the procedure 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

## 2016-10-01 NOTE — Progress Notes (Signed)
Ryan, [name] ([id number]) Visit Report for 10/01/2016 Arrival Information Details Patient Name: [name] Date of Service: 10/01/2016 8:00 AM Medical Record Number: [id number] Patient Account Number: [id number] Date of Birth/Sex: 1961/08/27 (55 y.o. Male) Treating RN: Cornell Barman Primary Care Physician: Nicky Pugh Other Clinician: Referring Physician: Nicky Pugh Treating Physician/Extender: Frann Rider in Treatment: 8 Visit Information History Since Last Visit Added or deleted any medications: No Patient Arrived: Ambulatory Any new allergies or adverse reactions: No Arrival Time: 08:03 Had a fall or experienced change in No Accompanied By: self activities of daily living that may affect Transfer Assistance: None risk of falls: Patient Identification Verified: Yes Signs or symptoms of abuse/neglect since last No Secondary Verification Process Yes visito Completed: Hospitalized since last visit: No Patient Requires Transmission-Based No Pain Present Now: No Precautions: Patient Has Alerts: No Electronic Signature(s) Signed: 10/01/2016 8:07:13 AM By: Gretta Cool, RN, BSN, Kim RN, BSN Entered By: Gretta Cool, RN, BSN, Kim on 10/01/2016 08:07:13 [name] ([id number]) -------------------------------------------------------------------------------- [place] Details Patient Name: [name] Date of Service: 10/01/2016 8:00 AM Medical Record Number: [id number] Patient Account Number: [id number] Date of Birth/Sex: [birth year] (55 y.o. Male) Treating RN: Cornell Barman Primary Care Physician: Nicky Pugh Other Clinician: Referring Physician: Nicky Pugh Treating Physician/Extender: Frann Rider in Treatment: 8 Vital Signs Time Taken: 08:10 Temperature (F): 97.9 Height (in): 68 Pulse (bpm): 68 Weight (lbs): 115 Respiratory Rate (breaths/min): 16 Body Mass Index (BMI): 17.5 Blood Pressure (mmHg): 132/68 Reference Range: 80 - 120 mg / dl Electronic  Signature(s) Signed: 10/01/2016 8:19:24 AM By: Gretta Cool, RN, BSN, Kim RN, BSN Entered By: Gretta Cool, RN, BSN, Kim on 10/01/2016 [id number]

## 2016-10-01 NOTE — Progress Notes (Signed)
[name], [name] ([id number]) Visit Report for 09/30/2016 HBO Details Patient Name: [name], [name] Date of Service: 09/30/2016 8:00 AM Medical Record Number: [id number] Patient Account Number: [id number] Date of Birth/Sex: 1961/09/30 (55 y.o. 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 HBO Treatment Details Treatment Number: 26 Patient Type: Outpatient Chamber Type: Monoplace Chamber Serial #: [id number] Treatment Protocol: 2.0 ATA with 90 minutes oxygen, and no air breaks Treatment Details Compression Rate Down: 1.5 psi / minute De-Compression Rate Up: 1.5 psi / minute Air breaks and breathing Compress Tx Pressure periods Decompress Decompress Begins Reached (leave unused spaces Begins Ends blank) Chamber Pressure (ATA) 1 2 - - - - - - 2 1 Clock Time (24 hr) 08:30 08:44 - - - - - - 10:15 10:27 Treatment Length: 117 (minutes) Treatment Segments: 4 Capillary Blood Glucose Pre Capillary Blood Glucose (mg/dl): Post Capillary Blood Glucose (mg/dl): Vital Signs Capillary Blood Glucose Reference Range: 80 - 120 mg / dl HBO Diabetic Blood Glucose Intervention Range: <131 mg/dl or >249 mg/dl Time Vitals Blood Respiratory Capillary Blood Glucose Pulse Action Type: Pulse: Temperature: Taken: Pressure: Rate: Glucose (mg/dl): Meter #: Oximetry (%) Taken: Pre 08:15 132/76 80 18 98.4 Post 10:30 140/82 72 18 98.7 Treatment Response Well [name], [name] ([id number]) Treatment Toleration: Treatment Treatment Completed without Adverse Event Completion Status: HBO Attestation I certify that I supervised this HBO treatment in accordance with Medicare  guidelines. A trained Yes emergency response team is readily available per hospital policies and procedures. Continue HBOT as ordered. Yes Electronic Signature(s) Signed: 09/30/2016 11:25:05 AM By: Christin Fudge MD, FACS Previous Signature: 09/30/2016 10:57:55 AM Version By: Konrad Penta By: Christin Fudge on 09/30/2016 11:25:05 [name], [name] ([id number]) -------------------------------------------------------------------------------- HBO Safety Checklist Details Patient Name: [name] Date of Service: 09/30/2016 8:00 AM Medical Record Number: [id number] Patient Account Number: [id number] Date of Birth/Sex: 02/27/1961 (55 y.o. Male) Treating RN: Primary Care Physician: Nicky Pugh Other Clinician: Referring Physician: Nicky Pugh Treating Physician/Extender: Frann Rider in Treatment: 8 HBO Safety Checklist Items Safety Checklist Consent Form Signed Patient voided / foley secured and emptied When did you last eato Breakfast prior to clinic Last dose of injectable or oral agent NA Ostomy pouch emptied and vented if applicable NA All implantable devices assessed, documented and approved NA Intravenous access site secured and place Valuables secured Linens and cotton and cotton/polyester blend (less than 51% polyester) NA Personal oil-based products / skin lotions / body lotions removed NA Wigs or hairpieces removed NA Smoking or tobacco materials removed NA Books / newspapers / magazines / loose paper removed NA Cologne, aftershave, perfume and deodorant removed NA Jewelry removed (may wrap wedding band) NA Make-up removed NA Hair care products removed NA Battery operated devices (external) removed NA Heating patches and chemical warmers removed NA Titanium eyewear removed NA Nail polish cured greater than 10 hours NA Casting material cured greater than 10 hours NA Hearing aids removed NA Loose dentures or partials removed NA Prosthetics have been  removed Patient demonstrates correct use of air break device (if applicable) NA Patient concerns have been addressed Patient grounding bracelet on and cord attached to chamber Specifics for Inpatients (complete in addition to above) NA Medication sheet sent with patient NA [name], [name] ([id number]) Intravenous medications needed or  due during therapy sent with patient Drainage tubes (e.g. nasogastric tube or chest tube secured and NA vented) Endotracheal or Tracheotomy tube secured NA Cuff deflated of air and inflated with saline NA Airway suctioned NA Electronic Signature(s) Signed: 09/30/2016 10:57:55 AM By: Konrad Penta By: Clinton Quant on 09/30/2016 10:31:23

## 2016-10-02 NOTE — Progress Notes (Signed)
Ryan, [name] ([id number]) Visit Report for 10/01/2016 HBO Details Patient Name: [name], [name] Date of Service: 10/01/2016 8:00 AM Medical Record Number: [id number] Patient Account Number: [id number] Date of Birth/Sex: Dec 28, 1960 (55 y.o. 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 HBO Treatment Details Treatment Number: 27 Patient Type: Outpatient Chamber Type: Monoplace Chamber Serial #: [id number] Treatment Protocol: 2.0 ATA with 90 minutes oxygen, and no air breaks Treatment Details Compression Rate Down: 1.5 psi / minute De-Compression Rate Up: 1.5 psi / minute Air breaks and breathing Compress Tx Pressure periods Decompress Decompress Begins Reached (leave unused spaces Begins Ends blank) Chamber Pressure (ATA) 1 2 - - - - - - 2 1 Clock Time (24 hr) 08:18 08:28 - - - - - - 09:59 10:10 Treatment Length: 112 (minutes) Treatment Segments: 4 Capillary Blood Glucose Pre Capillary Blood Glucose (mg/dl): Post Capillary Blood Glucose (mg/dl): Vital Signs Capillary Blood Glucose Reference Range: 80 - 120 mg / dl HBO Diabetic Blood Glucose Intervention Range: <131 mg/dl or >249 mg/dl Time Vitals Blood Respiratory Capillary Blood Glucose Pulse Action Type: Pulse: Temperature: Taken: Pressure: Rate: Glucose (mg/dl): Meter #: Oximetry (%) Taken: Pre 08:10 132/68 68 16 97.9 Post 10:14 132/78 80 16 97.8 Treatment Notes RN wrapped trach collar with kerlix to secure velcro while in chamber. [place], Jyren ([id number]) HBO Attestation I certify that I supervised this HBO treatment in accordance with Medicare guidelines. A  trained Yes emergency response team is readily available per hospital policies and procedures. Continue HBOT as ordered. Yes Electronic Signature(s) Signed: 10/01/2016 10:26:03 AM By: Christin Fudge MD, FACS Previous Signature: 10/01/2016 10:19:05 AM Version By: Gretta Cool RN, BSN, Kim RN, BSN Previous Signature: 10/01/2016 10:17:25 AM Version By: Gretta Cool RN, BSN, Kim RN, BSN Previous Signature: 10/01/2016 10:17:12 AM Version By: Gretta Cool RN, BSN, Kim RN, BSN Previous Signature: 10/01/2016 10:05:06 AM Version By: Gretta Cool, RN, BSN, Kim RN, BSN Previous Signature: 10/01/2016 8:32:46 AM Version By: Gretta Cool, RN, BSN, Kim RN, BSN Previous Signature: 10/01/2016 8:20:43 AM Version By: Gretta Cool, RN, BSN, Kim RN, BSN Entered By: Christin Fudge on 10/01/2016 10:26:03 [name] ([id number]) -------------------------------------------------------------------------------- HBO Safety Checklist Details Patient Name: Ryan, [name] Date of Service: 10/01/2016 8:00 AM Medical Record Number: [id number] Patient Account Number: [id number] Date of Birth/Sex: September 16, 1961 (55 y.o. Male) Treating RN: Cornell Barman Primary Care Physician: Nicky Pugh Other Clinician: Referring Physician: Nicky Pugh Treating Physician/Extender: Frann Rider in Treatment: 8 HBO Safety Checklist Items Safety Checklist Consent Form Signed Patient voided / foley secured and emptied When did you last eato 6:30 am Last dose of injectable or oral agent n/a NA Ostomy pouch emptied and vented if applicable All implantable devices assessed, documented and approved NA Intravenous access site secured and place Valuables secured Linens and cotton and cotton/polyester blend (less than 51% polyester) Personal oil-based products / skin lotions / body lotions removed NA Wigs or hairpieces removed NA Smoking or tobacco materials removed Books / newspapers / magazines / loose paper removed Cologne, aftershave, perfume and deodorant  removed Jewelry removed (may wrap wedding band) NA Make-up removed NA Hair care products removed NA Battery operated devices (external) removed NA Heating patches and chemical warmers removed NA Titanium eyewear removed NA Nail polish cured greater than 10 hours NA Casting  material cured greater than 10 hours NA Hearing aids removed NA Loose dentures or partials removed NA Prosthetics have been removed Patient demonstrates correct use of air break device (if applicable) Patient concerns have been addressed Patient grounding bracelet on and cord attached to chamber Specifics for Inpatients (complete in addition to above) Medication sheet sent with patient Intravenous medications needed or due during therapy sent with patient JERMAR, FERRETT ([id number]) Drainage tubes (e.g. nasogastric tube or chest tube secured and vented) Endotracheal or Tracheotomy tube secured Cuff deflated of air and inflated with saline Airway suctioned Electronic Signature(s) Signed: 10/01/2016 8:20:21 AM By: Gretta Cool, RN, BSN, Kim RN, BSN Entered By: Gretta Cool, RN, BSN, Kim on 10/01/2016 08:20:21

## 2016-10-04 ENCOUNTER — Encounter: Payer: Medicaid Other | Admitting: Surgery

## 2016-10-04 DIAGNOSIS — C139 Malignant neoplasm of hypopharynx, unspecified: Secondary | ICD-10-CM | POA: Diagnosis not present

## 2016-10-04 NOTE — Progress Notes (Signed)
KHALIS, GRAIG ([id number]) Visit Report for 10/04/2016 Arrival Information Details Patient Name: [name] Date of Service: 10/04/2016 8:00 AM Medical Record Number: [id number] Patient Account Number: [id number] Date of Birth/Sex: August 20, 1961 (55 y.o. Male) Treating RN: Primary Care Physician: Nicky Pugh Other Clinician: Clinton Quant Referring Physician: Nicky Pugh Treating Physician/Extender: Frann Rider in Treatment: 8 Visit Information History Since Last Visit All ordered tests and consults were completed: No Patient Arrived: Ambulatory Added or deleted any medications: No Arrival Time: 08:15 Any new allergies or adverse reactions: No Accompanied By: Self Had a fall or experienced change in No Transfer Assistance: None activities of daily living that may affect Patient Identification Verified: Yes risk of falls: Secondary Verification Process Yes Signs or symptoms of abuse/neglect since last No Completed: visito Patient Requires Transmission-Based No Hospitalized since last visit: No Precautions: Pain Present Now: No Patient Has Alerts: No Electronic Signature(s) Signed: 10/04/2016 10:43:18 AM By: Konrad Penta By: Clinton Quant on 10/04/2016 08:59:45 [place], [place] ([id number]) -------------------------------------------------------------------------------- Encounter Discharge Information Details Patient Name: [name] Date of Service: 10/04/2016 8:00 AM Medical Record Number: [id number] Patient Account Number: [id number] Date of Birth/Sex: 1961-05-26 (55 y.o. Male) Treating RN: Primary Care Physician: Nicky Pugh Other Clinician: Referring Physician: Nicky Pugh Treating Physician/Extender: Frann Rider in Treatment: 8 Encounter Discharge Information Items Discharge Pain Level: 0 Discharge Condition: Stable Ambulatory Status: Ambulatory Discharge Destination: Home Transportation: Other Accompanied By: Self Schedule  Follow-up Appointment: No Medication Reconciliation completed No and provided to Patient/Care Swati Granberry: Patient Clinical Summary of Care: Declined Electronic Signature(s) Signed: 10/04/2016 10:43:18 AM By: Konrad Penta By: Clinton Quant on 10/04/2016 10:42:48 [place], [name] ([id number]) -------------------------------------------------------------------------------- Patient/Caregiver Education Details Patient Name: [name] Date of Service: 10/04/2016 8:00 AM Medical Record Number: [id number] Patient Account Number: [id number] Date of Birth/Gender: 1961-10-08 (55 y.o. Male) Treating RN: Primary Care Physician: Nicky Pugh Other Clinician: Referring Physician: Nicky Pugh Treating Physician/Extender: Frann Rider in Treatment: 8 Education Assessment Education Provided To: Patient Education Topics Provided Electronic Signature(s) Signed: 10/04/2016 10:43:18 AM By: Konrad Penta By: Clinton Quant on 10/04/2016 10:42:24 [name] ([id number]) -------------------------------------------------------------------------------- Vitals Details Patient Name: [name] Date of Service: 10/04/2016 8:00 AM Medical Record Number: [id number] Patient Account Number: [id number] Date of Birth/Sex: [birth year] (55 y.o. Male) Treating RN: Primary Care Physician: Nicky Pugh Other Clinician: Clinton Quant Referring Physician: Nicky Pugh Treating Physician/Extender: Frann Rider in Treatment: 8 Vital Signs Time Taken: 08:30 Temperature (F): 98.2 Height (in): 68 Pulse (bpm): 80 Source: Stated Respiratory Rate (breaths/min): 18 Weight (lbs): 115 Blood Pressure (mmHg): 126/78 Source: Stated Reference Range: 80 - 120 mg / dl Body Mass Index (BMI): 17.5 Electronic Signature(s) Signed: 10/04/2016 10:43:18 AM By: Konrad Penta By: Clinton Quant on 10/04/2016 08:59:49

## 2016-10-04 NOTE — Progress Notes (Signed)
[name], [name] ([id number]) Visit Report for 10/04/2016 HBO Details Patient Name: [name], [name] Date of Service: 10/04/2016 8:00 AM Medical Record Number: [id number] Patient Account Number: [id number] Date of Birth/Sex: Dec 26, 1960 (55 y.o. 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 HBO Treatment Details Treatment Number: 28 Patient Type: Outpatient Chamber Type: Monoplace Chamber Serial #: [id number] Treatment Protocol: 2.0 ATA with 90 minutes oxygen, and no air breaks Treatment Details Compression Rate Down: 1.5 psi / minute De-Compression Rate Up: 1.5 psi / minute Air breaks and breathing Compress Tx Pressure periods Decompress Decompress Begins Reached (leave unused spaces Begins Ends blank) Chamber Pressure (ATA) 1 2 - - - - - - 2 1 Clock Time (24 hr) 08:37 08:43 - - - - - - 10:14 10:24 Treatment Length: 107 (minutes) Treatment Segments: 4 Capillary Blood Glucose Pre Capillary Blood Glucose (mg/dl): Post Capillary Blood Glucose (mg/dl): Vital Signs Capillary Blood Glucose Reference Range: 80 - 120 mg / dl HBO Diabetic Blood Glucose Intervention Range: <131 mg/dl or >249 mg/dl Time Vitals Blood Respiratory Capillary Blood Glucose Pulse Action Type: Pulse: Temperature: Taken: Pressure: Rate: Glucose (mg/dl): Meter #: Oximetry (%) Taken: Pre 08:30 126/78 80 18 98.2 Post 10:25 144/80 80 18 97.8 Treatment Response Well [name], Ryan ([id number]) Treatment Toleration: Treatment Treatment Completed without Adverse Event Completion Status: HBO Attestation I certify that I supervised this HBO treatment in accordance  with Medicare guidelines. A trained Yes emergency response team is readily available per hospital policies and procedures. Continue HBOT as ordered. Yes Electronic Signature(s) Signed: 10/04/2016 11:02:24 AM By: Christin Fudge MD, FACS Previous Signature: 10/04/2016 10:43:18 AM Version By: Konrad Penta By: Christin Fudge on 10/04/2016 11:02:23 [name], [name] ([id number]) -------------------------------------------------------------------------------- HBO Safety Checklist Details Patient Name: [name] Date of Service: 10/04/2016 8:00 AM Medical Record Number: [id number] Patient Account Number: [id number] Date of Birth/Sex: March 05, 1961 (55 y.o. Male) Treating RN: Primary Care Physician: Nicky Pugh Other Clinician: Clinton Quant Referring Physician: Nicky Pugh Treating Physician/Extender: Frann Rider in Treatment: 8 HBO Safety Checklist Items Safety Checklist Consent Form Signed Patient voided / foley secured and emptied When did you last eato Breakfast prior to clinic Last dose of injectable or oral agent NA Ostomy pouch emptied and vented if applicable NA All implantable devices assessed, documented and approved Intravenous access site secured and place Valuables secured Linens and cotton and cotton/polyester blend (less than 51% polyester) NA Personal oil-based products / skin lotions / body lotions removed NA Wigs or hairpieces removed NA Smoking or tobacco materials removed NA Books / newspapers / magazines / loose paper removed NA Cologne, aftershave, perfume and deodorant removed NA Jewelry removed (may wrap wedding band) NA Make-up removed NA Hair care products removed NA Battery operated devices (external) removed NA Heating patches and chemical warmers removed NA Titanium eyewear removed NA Nail polish cured greater than 10 hours NA Casting material cured greater than 10 hours NA Hearing aids removed NA Loose dentures or partials  removed NA Prosthetics have been removed Patient demonstrates correct use of air break device (if applicable) NA Patient concerns have been addressed Patient grounding bracelet on and cord attached to chamber Specifics for Inpatients (complete in addition to above) NA Medication sheet sent with patient NA Intravenous medications needed or  due during therapy sent with patient ROBBIN, [name] ([id number]) NA Drainage tubes (e.g. nasogastric tube or chest tube secured and vented) NA Endotracheal or Tracheotomy tube secured NA Cuff deflated of air and inflated with saline NA Airway suctioned Electronic Signature(s) Signed: 10/04/2016 10:43:18 AM By: Konrad Penta By: Clinton Quant on 10/04/2016 10:15:14

## 2016-10-04 NOTE — Progress Notes (Signed)
SLAYTON, MCLARNEY ([id number]) Visit Report for 10/04/2016 Physician Orders Details Patient Name: CASHTON, KUSTRA Date of Service: 10/04/2016 8:00 AM Medical Record Number: [id number] Patient Account Number: [id number] Date of Birth/Sex: 28-Jan-1961 (55 y.o. 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, unspecified 08/03/2016 Yes 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 Facility Procedures CPT4: Description Modifier Quantity Code [id number]-Physician attendance and supervision of hyperbaric oxygen 1 therapy, per session ICD-10 Description Diagnosis L59.8 Other specified disorders of the skin and subcutaneous tissue related to radiation  Y84.2 Radiological procedure and radiotherapy as the cause of abnormal reaction of the patient, or of later complication, without mention of misadventure at the time of the procedure 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, unspecified Physician Procedures CPT4: Description Modifier Quantity Code [id number] - WC PHYS HYPERBARIC OXYGEN THERAPY 1 ICD-10 Description  Diagnosis L59.8 Other specified disorders of the skin and subcutaneous tissue related to radiation Y84.2 Radiological procedure and  radiotherapy as the cause of abnormal reaction of the patient, or of later complication, without mention of misadventure at the time of the procedure 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

## 2016-10-05 ENCOUNTER — Encounter: Payer: Medicaid Other | Admitting: Physician Assistant

## 2016-10-05 DIAGNOSIS — C139 Malignant neoplasm of hypopharynx, unspecified: Secondary | ICD-10-CM | POA: Diagnosis not present

## 2016-10-05 NOTE — Progress Notes (Signed)
YGNACIO, RADEK ([id number]) Visit Report for 10/05/2016 Arrival Information Details Patient Name: [name], [name] Date of Service: 10/05/2016 8:00 AM Medical Record Number: [id number] Patient Account Number: [id number] Date of Birth/Sex: 05-Feb-1961 (55 y.o. Male) Treating RN: Primary Care Physician: Nicky Pugh Other Clinician: Referring Physician: Nicky Pugh Treating Physician/Extender: Melburn Hake, HOYT Weeks in Treatment: 9 Visit Information History Since Last Visit All ordered tests and consults were completed: No Patient Arrived: Ambulatory Added or deleted any medications: No Arrival Time: 08:30 Any new allergies or adverse reactions: No Accompanied By: Self Had a fall or experienced change in No Transfer Assistance: None activities of daily living that may affect Patient Identification Verified: Yes risk of falls: Secondary Verification Process Yes Signs or symptoms of abuse/neglect since last No Completed: visito Patient Requires Transmission-Based No Hospitalized since last visit: No Precautions: Pain Present Now: No Patient Has Alerts: No Electronic Signature(s) Signed: 10/05/2016 11:03:05 AM By: Konrad Penta By: Clinton Quant on 10/05/2016 08:39:43 [place], [place] ([id number]) -------------------------------------------------------------------------------- Encounter Discharge Information Details Patient Name: [name] Date of Service: 10/05/2016 8:00 AM Medical Record Number: [id number] Patient Account Number: [id number] Date of Birth/Sex: February 25, 1961 (55 y.o. Male) Treating RN: Primary Care Physician: Nicky Pugh Other Clinician: Referring Physician: Nicky Pugh Treating Physician/Extender: STONE III, HOYT Weeks in Treatment: 9 Encounter Discharge Information Items Discharge Pain Level: 0 Discharge Condition: Stable Ambulatory Status: Ambulatory Discharge Destination: Home Transportation: Other Accompanied By: Self Schedule Follow-up  Appointment: No Medication Reconciliation completed No and provided to Patient/Care Anjulie Dipierro: Patient Clinical Summary of Care: Declined Electronic Signature(s) Signed: 10/05/2016 11:03:05 AM By: Konrad Penta By: Clinton Quant on 10/05/2016 11:02:48 [name] ([id number]) -------------------------------------------------------------------------------- Patient/Caregiver Education Details Patient Name: [name] Date of Service: 10/05/2016 8:00 AM Medical Record Number: [id number] Patient Account Number: [id number] Date of Birth/Gender: [birth year] (55 y.o. Male) Treating RN: Primary Care Physician: Nicky Pugh Other Clinician: Referring Physician: Nicky Pugh Treating Physician/Extender: Sharalyn Ink in Treatment: 9 Education Assessment Education Provided To: Patient Education Topics Provided Electronic Signature(s) Signed: 10/05/2016 11:03:05 AM By: Konrad Penta By: Clinton Quant on 10/05/2016 11:02:34 [place], [name] ([id number]) -------------------------------------------------------------------------------- Vitals Details Patient Name: [name] Date of Service: 10/05/2016 8:00 AM Medical Record Number: [id number] Patient Account Number: [id number] Date of Birth/Sex: Aug 01, 1961 (55 y.o. Male) Treating RN: Primary Care Physician: Nicky Pugh Other Clinician: Referring Physician: Nicky Pugh Treating Physician/Extender: STONE III, HOYT Weeks in Treatment: 9 Vital Signs Time Taken: 08:30 Temperature (F): 97.7 Height (in): 68 Pulse (bpm): 88 Source: Stated Respiratory Rate (breaths/min): 18 Weight (lbs): 115 Blood Pressure (mmHg): 122/76 Source: Stated Reference Range: 80 - 120 mg / dl Body Mass Index (BMI): 17.5 Electronic Signature(s) Signed: 10/05/2016 11:03:05 AM By: Konrad Penta By: Clinton Quant on 10/05/2016 08:40:19

## 2016-10-06 ENCOUNTER — Encounter: Payer: Medicaid Other | Attending: Internal Medicine | Admitting: Internal Medicine

## 2016-10-06 DIAGNOSIS — C139 Malignant neoplasm of hypopharynx, unspecified: Secondary | ICD-10-CM | POA: Insufficient documentation

## 2016-10-06 DIAGNOSIS — Z930 Tracheostomy status: Secondary | ICD-10-CM | POA: Diagnosis not present

## 2016-10-06 DIAGNOSIS — L598 Other specified disorders of the skin and subcutaneous tissue related to radiation: Secondary | ICD-10-CM | POA: Diagnosis present

## 2016-10-06 DIAGNOSIS — Y842 Radiological procedure and radiotherapy as the cause of abnormal reaction of the patient, or of later complication, without mention of misadventure at the time of the procedure: Secondary | ICD-10-CM | POA: Insufficient documentation

## 2016-10-06 NOTE — Progress Notes (Signed)
[name], [name] ([id number]) Visit Report for 10/05/2016 Physician Orders Details Patient Name: [name], [name] Date of Service: 10/05/2016 8:00 AM Medical Record Number: [id number] Patient Account Number: [id number] Date of Birth/Sex: June 06, 1961 (55 y.o. 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, unspecified 08/03/2016 Yes 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 Facility Procedures CPT4 Code: [id number] Description: (Facility Use Only) HBOT, full body chamber, 58min Modifier: Quantity: 4 Physician Procedures CPT4: Description Modifier Quantity Code [id number] - WC PHYS HYPERBARIC OXYGEN THERAPY 1 ICD-10 Description Diagnosis Y84.2 Radiological procedure and radiotherapy as the cause of abnormal reaction of the patient, or of later complication, without  mention of misadventure at the time of the procedure L59.8 Other specified disorders of the skin and subcutaneous tissue related to radiation 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

## 2016-10-06 NOTE — Progress Notes (Addendum)
[name], [name] ([id number]) Visit Report for 10/06/2016 HBO Details Patient Name: [name], [name] Date of Service: 10/06/2016 8:00 AM Medical Record Patient Account Number: [id number] Number: Treating RN: Cornell Barman [birth year] (54 y.o. 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 End Date: Treatment Series Complete; Non- HBO Discharge Wound Protocol Completed with Outcome: Symptom Relief HBO Treatment Details Treatment Number: 30 Patient Type: Outpatient Chamber Type: Monoplace Chamber Serial #: [id number] Treatment Protocol: 2.0 ATA with 90 minutes oxygen, and no air breaks Treatment Details Compression Rate Down: 1.5 psi / minute De-Compression Rate Up: 1.5 psi / minute Air breaks and breathing Compress Tx Pressure periods Decompress Decompress Begins Reached (leave unused spaces Begins Ends blank) Chamber Pressure (ATA) 1 2 - - - - - - 2 1 Clock Time (24 hr) 08:31 08:41 - - - - - - 10:12 10:22 Treatment Length: 111 (minutes) Treatment Segments: 4 Capillary Blood Glucose Pre Capillary Blood Glucose (mg/dl): Post Capillary Blood Glucose (mg/dl): Vital Signs Capillary Blood Glucose Reference Range: 80 - 120 mg / dl HBO Diabetic Blood Glucose Intervention Range: <131 mg/dl or >249 mg/dl Time Vitals Blood Respiratory Capillary Blood Glucose Pulse Action Type: Pulse: Temperature: Taken: Pressure: Rate: Glucose (mg/dl): Meter #: Oximetry (%) Taken: [place], [name] ([id number]) Pre 08:25 118/68 62 18 98.1 Post 10:25 118/70 64 16 98.1 Treatment  Response Treatment Well Toleration: Treatment Treatment Completed without Adverse Event Completion Status: Physician Notes No concerns or treatment given HBO Attestation I certify that I supervised this HBO treatment in accordance with Medicare guidelines. A trained Yes emergency response team is readily available per hospital policies and procedures. Continue HBOT as ordered. Yes Electronic Signature(s) Signed: 10/06/2016 5:04:57 PM By: Linton Ham MD Previous Signature: 10/06/2016 10:29:02 AM Version By: Gretta Cool RN, BSN, Kim RN, BSN Previous Signature: 10/06/2016 10:27:47 AM Version By: Gretta Cool RN, BSN, Kim RN, BSN Previous Signature: 10/06/2016 10:22:27 AM Version By: Gretta Cool RN, BSN, Kim RN, BSN Previous Signature: 10/06/2016 10:13:34 AM Version By: Gretta Cool RN, BSN, Kim RN, BSN Previous Signature: 10/06/2016 8:41:59 AM Version By: Gretta Cool RN, BSN, Kim RN, BSN Previous Signature: 10/06/2016 8:39:57 AM Version By: Gretta Cool RN, BSN, Kim RN, BSN Previous Signature: 10/06/2016 8:34:31 AM Version By: Gretta Cool RN, BSN, Kim RN, BSN Entered By: Linton Ham on 10/06/2016 13:38:39 [name], [name] ([id number]) -------------------------------------------------------------------------------- HBO Safety Checklist Details Patient Name: [name] Date of Service: 10/06/2016 8:00 AM Medical Record Patient Account Number: [id number] Number: Treating RN: Cornell Barman [birth year] (54 y.o. Other Clinician: Date of Birth/Sex: Male) Treating ROBSON, MICHAEL Primary Care Physician/Extender: Orland Dec, ERNEST Physician: Referring Physician: Nicky Pugh Weeks in Treatment: 9 HBO Safety Checklist Items Safety Checklist Consent Form Signed Patient voided / foley secured and emptied When did you last eato 0700 Last dose of injectable or oral agent n/a NA Ostomy pouch emptied and vented if applicable All implantable devices assessed, documented and approved NA Intravenous access site secured and  place NA Valuables secured Linens and cotton and cotton/polyester blend (less than 51% polyester) Personal oil-based products / skin lotions / body lotions removed NA Wigs or hairpieces removed Smoking or tobacco materials removed Books / newspapers / magazines / loose paper removed Cologne, aftershave, perfume and deodorant removed NA Jewelry removed (may  wrap wedding band) NA Make-up removed NA Hair care products removed NA Battery operated devices (external) removed NA Heating patches and chemical warmers removed NA Titanium eyewear removed NA Nail polish cured greater than 10 hours NA Casting material cured greater than 10 hours NA Hearing aids removed NA Loose dentures or partials removed NA Prosthetics have been removed Patient demonstrates correct use of air break device (if applicable) Patient concerns have been addressed Patient grounding bracelet on and cord attached to chamber Specifics for Inpatients (complete in addition to above) [place], [place] ([id number]) Medication sheet sent with patient Intravenous medications needed or due during therapy sent with patient Drainage tubes (e.g. nasogastric tube or chest tube secured and vented) Endotracheal or Tracheotomy tube secured Cuff deflated of air and inflated with saline Airway suctioned Electronic Signature(s) Signed: 10/06/2016 8:34:06 AM By: Gretta Cool, RN, BSN, Kim RN, BSN Entered By: Gretta Cool, RN, BSN, Kim on 10/06/2016 08:34:06

## 2016-10-06 NOTE — Progress Notes (Signed)
DAWES, UMANA ([id number]) Visit Report for 10/06/2016 Arrival Information Details Patient Name: [name], [name] Date of Service: 10/06/2016 8:00 AM Medical Record Patient Account Number: [id number] Number: Treating RN: Cornell Barman November 04, 1961 (55 y.o. Other Clinician: Date of Birth/Sex: Male) Treating ROBSON, MICHAEL Primary Care Physician/Extender: Orland Dec, ERNEST Physician: Referring Physician: Milana Na in Treatment: 9 Visit Information History Since Last Visit Added or deleted any medications: No Patient Arrived: Ambulatory Any new allergies or adverse reactions: No Arrival Time: 08:31 Had a fall or experienced change in No Accompanied By: self activities of daily living that may affect Transfer Assistance: None risk of falls: Patient Identification Verified: Yes Signs or symptoms of abuse/neglect since last No Secondary Verification Process Yes visito Completed: Hospitalized since last visit: No Patient Requires Transmission-Based No Pain Present Now: No Precautions: Patient Has Alerts: No Electronic Signature(s) Signed: 10/06/2016 8:32:51 AM By: Gretta Cool, RN, BSN, Kim RN, BSN Entered By: Gretta Cool, RN, BSN, Kim on 10/06/2016 08:32:50 [place], [name] ([id number]) -------------------------------------------------------------------------------- Encounter Discharge Information Details Patient Name: [name] Date of Service: 10/06/2016 8:00 AM Medical Record Patient Account Number: [id number] Number: Treating RN: Cornell Barman [birth year] (55 y.o. Other Clinician: Date of Birth/Sex: Male) Treating ROBSON, MICHAEL Primary Care Physician/Extender: Orland Dec, ERNEST Physician: Referring Physician: Milana Na in Treatment: 9 Encounter Discharge Information Items Discharge Pain Level: 0 Discharge Condition: Stable Ambulatory Status: Ambulatory Nursing Discharge Destination: Home Transportation: Private Auto Accompanied By: self Schedule  Follow-up Appointment: No Medication Reconciliation completed Yes and provided to Patient/Care Haely Leyland: Patient Clinical Summary of Care: Declined Notes Patient completed 30 HBO treatments today. Electronic Signature(s) Signed: 10/06/2016 10:30:12 AM By: Gretta Cool, RN, BSN, Kim RN, BSN Entered By: Gretta Cool, RN, BSN, Kim on 10/06/2016 10:30:12 [name] ([id number]) -------------------------------------------------------------------------------- Vitals Details Patient Name: [name] Date of Service: 10/06/2016 8:00 AM Medical Record Patient Account Number: [id number] Number: Treating RN: Cornell Barman 11/03/1961 (55 y.o. Other Clinician: Date of Birth/Sex: Male) Treating ROBSON, MICHAEL Primary Care Physician/Extender: Orland Dec, ERNEST Physician: Referring Physician: Nicky Pugh Weeks in Treatment: 9 Vital Signs Time Taken: 08:25 Temperature (F): 98.1 Height (in): 68 Pulse (bpm): 62 Weight (lbs): 115 Respiratory Rate (breaths/min): 18 Body Mass Index (BMI): 17.5 Blood Pressure (mmHg): 118/68 Reference Range: 80 - 120 mg / dl Electronic Signature(s) Signed: 10/06/2016 8:33:11 AM By: Gretta Cool, RN, BSN, Kim RN, BSN Entered By: Gretta Cool, RN, BSN, Kim on 10/06/2016 08:33:11

## 2016-10-06 NOTE — Progress Notes (Signed)
[name], [place] ([id number]) Visit Report for 10/05/2016 HBO Details Patient Name: [name], [name] Date of Service: 10/05/2016 8:00 AM Medical Record Number: [id number] Patient Account Number: [id number] Date of Birth/Sex: Jun 04, 1961 (55 y.o. 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 HBO Treatment Details Treatment Number: 29 Patient Type: Outpatient Chamber Type: Monoplace Chamber Serial #: [id number] Treatment Protocol: 2.0 ATA with 90 minutes oxygen, and no air breaks Treatment Details Compression Rate Down: 1.5 psi / minute De-Compression Rate Up: 1.5 psi / minute Air breaks and breathing Compress Tx Pressure periods Decompress Decompress Begins Reached (leave unused spaces Begins Ends blank) Chamber Pressure (ATA) 1 2 - - - - - - 2 1 Clock Time (24 hr) 08:34 08:44 - - - - - - 10:14 10:25 Treatment Length: 111 (minutes) Treatment Segments: 4 Capillary Blood Glucose Pre Capillary Blood Glucose (mg/dl): Post Capillary Blood Glucose (mg/dl): Vital Signs Capillary Blood Glucose Reference Range: 80 - 120 mg / dl HBO Diabetic Blood Glucose Intervention Range: <131 mg/dl or >249 mg/dl Time Vitals Blood Respiratory Capillary Blood Glucose Pulse Action Type: Pulse: Temperature: Taken: Pressure: Rate: Glucose (mg/dl): Meter #: Oximetry (%) Taken: Pre 08:30 122/76 88 18 97.7 Post 10:25 126/78 80 18 97.9 Treatment Response [place], [place] ([id number]) Treatment Toleration: Treatment Treatment Completed without Adverse Event Completion Status: Electronic Signature(s) Signed: 10/05/2016 11:03:05 AM By: Clinton Quant Signed:  10/06/2016 1:37:38 AM By: Worthy Keeler PA-C Entered By: Clinton Quant on 10/05/2016 11:02:04 [place], [place] ([id number]) -------------------------------------------------------------------------------- HBO Safety Checklist Details Patient Name: [name] Date of Service: 10/05/2016 8:00 AM Medical Record Number: [id number] Patient Account Number: [id number] Date of Birth/Sex: Feb 16, 1961 (55 y.o. Male) Treating RN: Primary Care Physician: Nicky Pugh Other Clinician: Referring Physician: Nicky Pugh Treating Physician/Extender: STONE III, HOYT Weeks in Treatment: 9 HBO Safety Checklist Items Safety Checklist Consent Form Signed Patient voided / foley secured and emptied When did you last eato Breakfast prior to clinic Last dose of injectable or oral agent NA Ostomy pouch emptied and vented if applicable All implantable devices assessed, documented and approved NA Intravenous access site secured and place Valuables secured Linens and cotton and cotton/polyester blend (less than 51% polyester) NA Personal oil-based products / skin lotions / body lotions removed NA Wigs or hairpieces removed NA Smoking or tobacco materials removed NA Books / newspapers / magazines / loose paper removed NA Cologne, aftershave, perfume and deodorant removed NA Jewelry removed (may wrap wedding band) NA Make-up removed NA Hair care products removed NA Battery operated devices (external) removed NA Heating patches and chemical warmers removed NA Titanium eyewear removed NA Nail polish cured greater than 10 hours NA Casting material cured greater than 10 hours NA Hearing aids removed NA Loose dentures or partials removed NA Prosthetics have been removed Patient demonstrates correct use of air break device (if applicable) NA Patient concerns have been addressed Patient grounding bracelet on and cord attached to chamber Specifics for Inpatients (complete in addition to above) NA Medication  sheet sent with patient NA Intravenous medications needed or due during therapy sent with patient [name], [name] ([id number]) NA Drainage tubes (e.g. nasogastric tube or chest tube secured and vented) NA Endotracheal or Tracheotomy tube secured NA Cuff deflated of air and inflated with saline NA Airway  suctioned Electronic Signature(s) Signed: 10/05/2016 11:03:05 AM By: Konrad Penta By: Clinton Quant on 10/05/2016 08:41:14

## 2017-11-25 IMAGING — CR DG CHEST 2V
1 series · 2 of 2 positions shown · non-contrast
Comparison: None.

CLINICAL DATA: Hyperbaric therapy

EXAM:
CHEST  2 VIEW

[Series 2: w chest pa · 0.14mm/px · 2 of 2 slices shown]
[im 1/2]
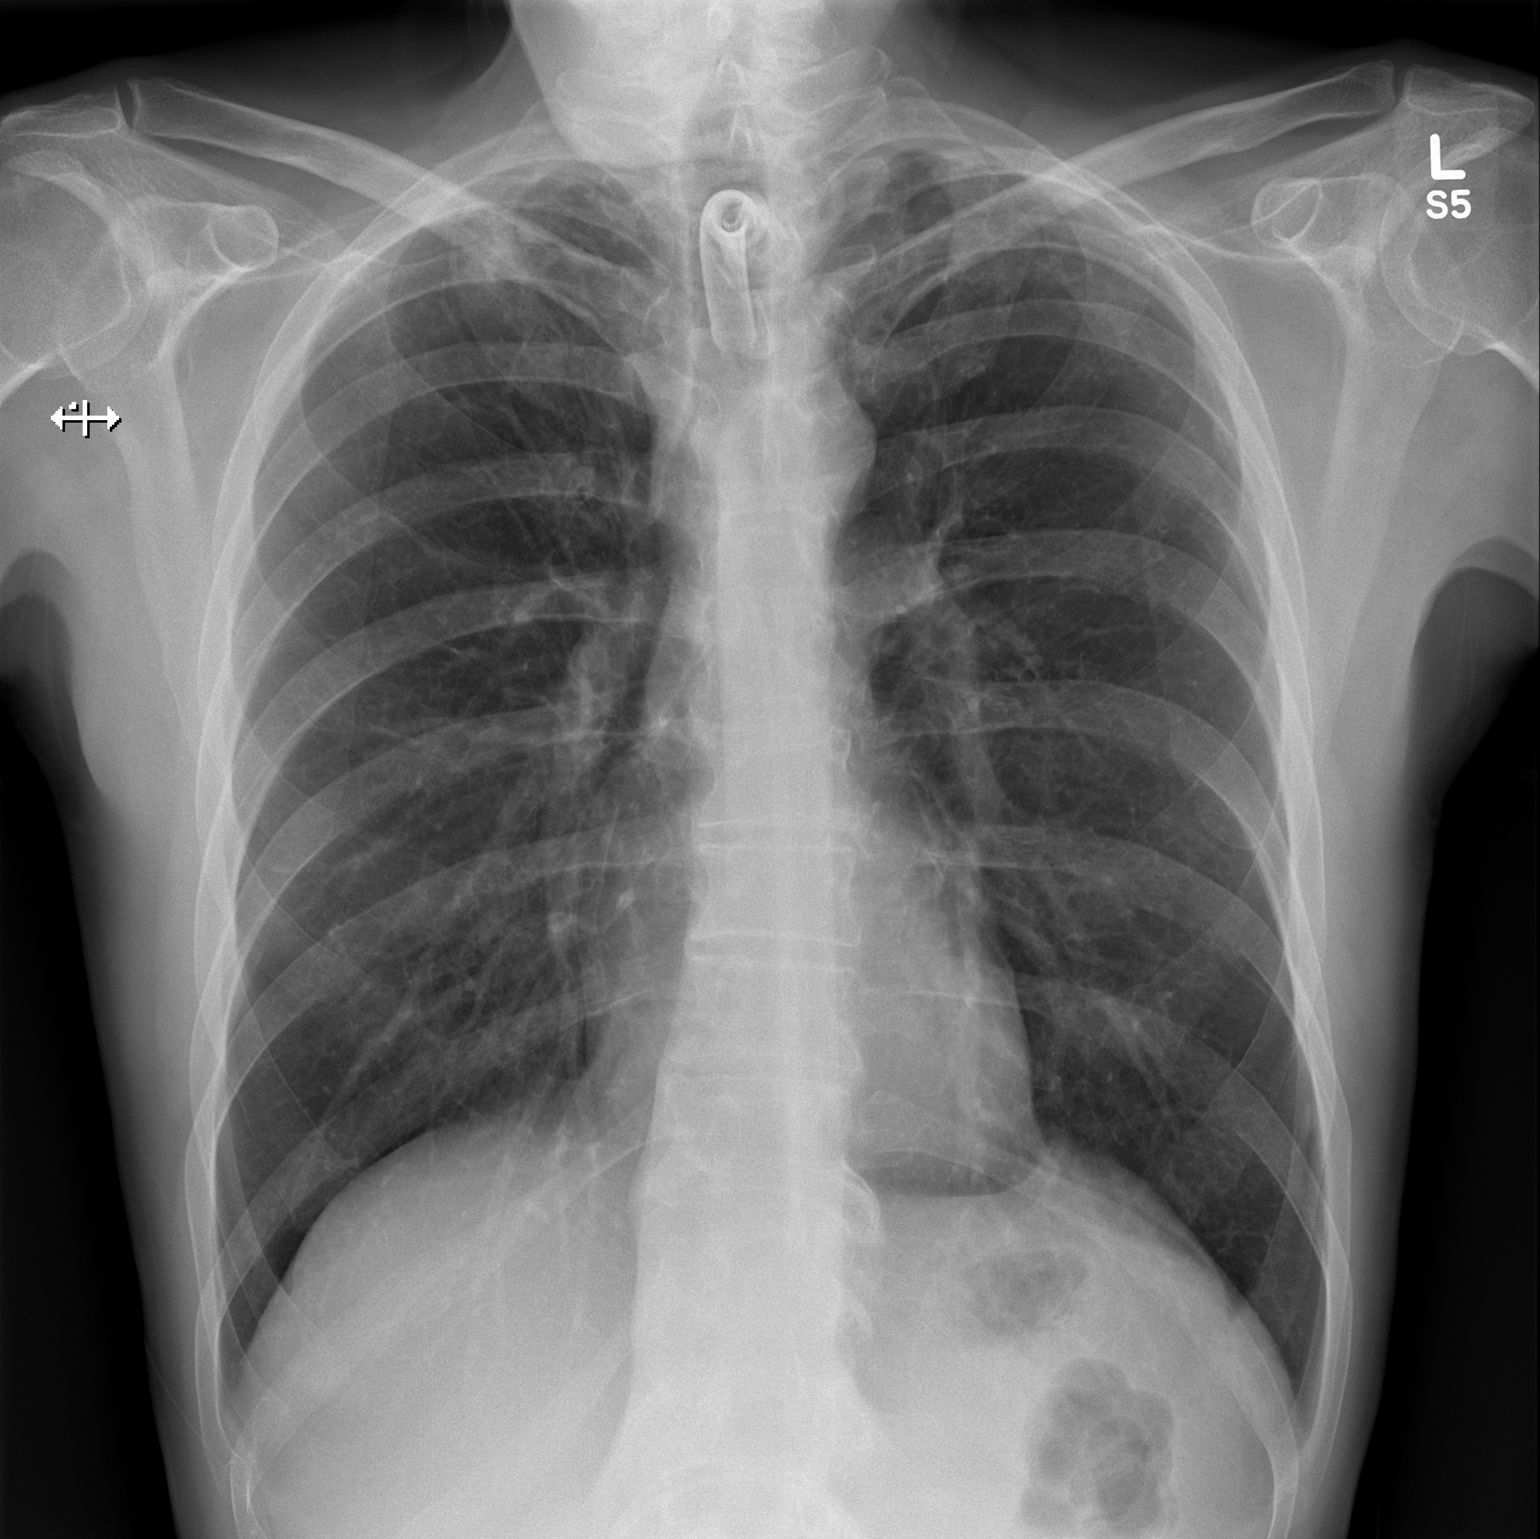
[im 2/2]
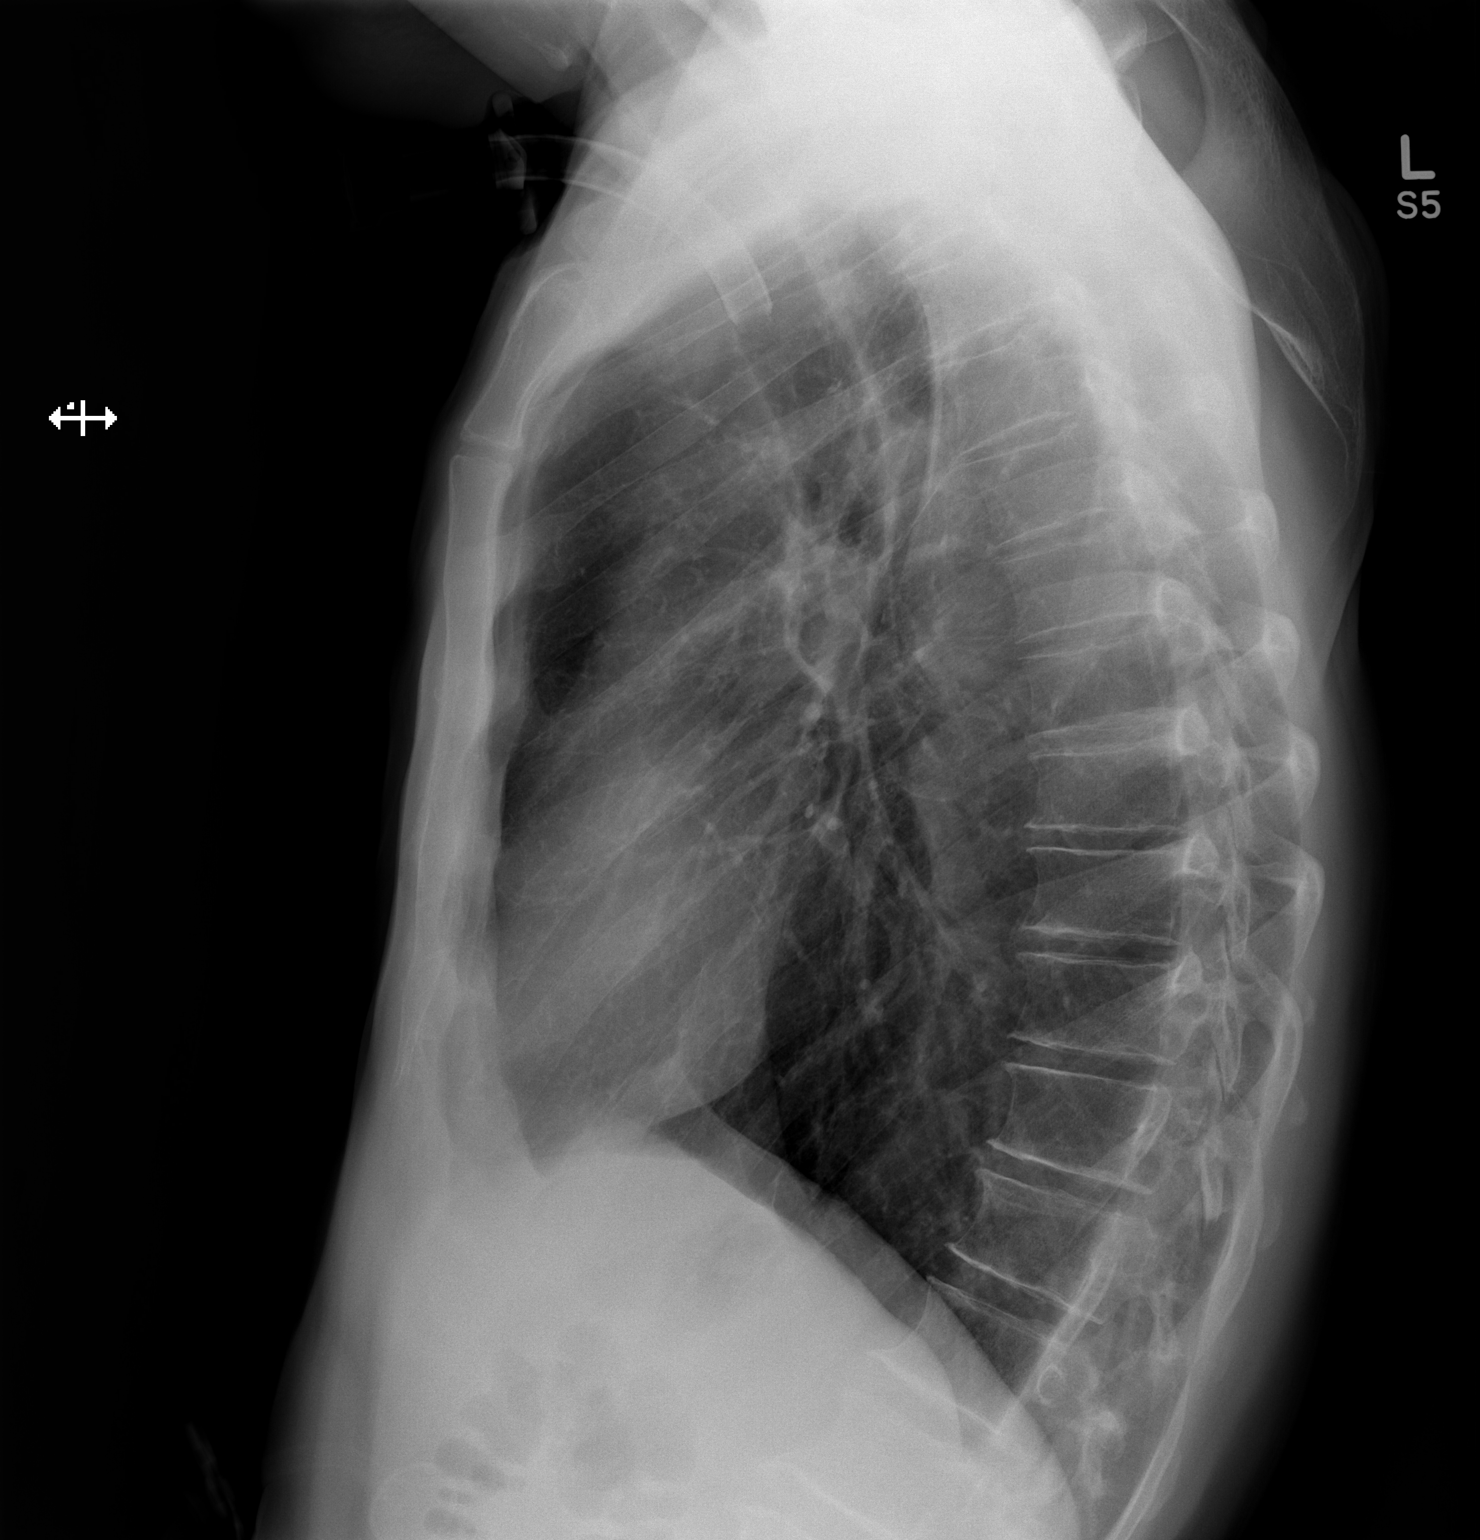

[2 of 2 positions shown; findings below may reference images not displayed]

FINDINGS: Cardiac shadow is within normal limits. The lungs are well aerated
bilaterally. Mild apical scarring is noted. Tracheostomy tube is
noted in satisfactory position. No acute bony abnormality is seen.
IMPRESSION: Chronic apical scarring without acute abnormality.
